# Patient Record
Sex: Female | Born: 1955
Health system: Southern US, Community
[De-identification: ages and names within clinical notes are randomized; demographics above are authoritative.]

## PROBLEM LIST (undated history)

## (undated) DIAGNOSIS — R519 Headache, unspecified: Secondary | ICD-10-CM

## (undated) DIAGNOSIS — M810 Age-related osteoporosis without current pathological fracture: Secondary | ICD-10-CM

## (undated) DIAGNOSIS — E785 Hyperlipidemia, unspecified: Secondary | ICD-10-CM

## (undated) DIAGNOSIS — K219 Gastro-esophageal reflux disease without esophagitis: Secondary | ICD-10-CM

## (undated) DIAGNOSIS — T7840XA Allergy, unspecified, initial encounter: Secondary | ICD-10-CM

## (undated) DIAGNOSIS — C439 Malignant melanoma of skin, unspecified: Secondary | ICD-10-CM

## (undated) DIAGNOSIS — I341 Nonrheumatic mitral (valve) prolapse: Secondary | ICD-10-CM

## (undated) DIAGNOSIS — G8929 Other chronic pain: Secondary | ICD-10-CM

## (undated) HISTORY — DX: Allergy, unspecified, initial encounter: T78.40XA

## (undated) HISTORY — DX: Gastro-esophageal reflux disease without esophagitis: K21.9

## (undated) HISTORY — DX: Other chronic pain: G89.29

## (undated) HISTORY — PX: BREAST BIOPSY: SHX20

## (undated) HISTORY — PX: AUGMENTATION MAMMAPLASTY: SUR837

## (undated) HISTORY — DX: Headache, unspecified: R51.9

## (undated) HISTORY — PX: TONSILLECTOMY: SHX5217

## (undated) HISTORY — DX: Hyperlipidemia, unspecified: E78.5

## (undated) HISTORY — DX: Age-related osteoporosis without current pathological fracture: M81.0

## (undated) HISTORY — DX: Nonrheumatic mitral (valve) prolapse: I34.1

## (undated) HISTORY — PX: WISDOM TOOTH EXTRACTION: SHX21

## (undated) HISTORY — PX: SKIN BIOPSY: SHX1

## (undated) HISTORY — DX: Malignant melanoma of skin, unspecified: C43.9

---

## 1992-02-28 HISTORY — PX: FOOT SURGERY: SHX648

## 2010-02-27 HISTORY — PX: OTHER SURGICAL HISTORY: SHX169

## 2010-02-27 HISTORY — PX: FACIAL COSMETIC SURGERY: SHX629

## 2012-07-04 ENCOUNTER — Encounter: Payer: Self-pay | Admitting: Gastroenterology

## 2012-07-09 ENCOUNTER — Encounter: Payer: Self-pay | Admitting: Gastroenterology

## 2012-08-01 ENCOUNTER — Encounter: Payer: Self-pay | Admitting: Gastroenterology

## 2012-08-01 ENCOUNTER — Ambulatory Visit (AMBULATORY_SURGERY_CENTER): Payer: BC Managed Care – PPO | Admitting: *Deleted

## 2012-08-01 VITALS — Ht 67.0 in | Wt 150.0 lb

## 2012-08-01 DIAGNOSIS — Z1211 Encounter for screening for malignant neoplasm of colon: Secondary | ICD-10-CM

## 2012-08-01 MED ORDER — NA SULFATE-K SULFATE-MG SULF 17.5-3.13-1.6 GM/177ML PO SOLN: ORAL | Status: DC

## 2012-08-15 ENCOUNTER — Encounter: Payer: Self-pay | Admitting: Gastroenterology

## 2012-08-15 DIAGNOSIS — J309 Allergic rhinitis, unspecified: Secondary | ICD-10-CM

## 2012-08-15 HISTORY — DX: Allergic rhinitis, unspecified: J30.9

## 2012-09-03 ENCOUNTER — Encounter: Payer: Self-pay | Admitting: Gastroenterology

## 2012-09-11 HISTORY — PX: COLONOSCOPY: SHX174

## 2012-09-12 ENCOUNTER — Ambulatory Visit (AMBULATORY_SURGERY_CENTER): Payer: BC Managed Care – PPO | Admitting: Gastroenterology

## 2012-09-12 ENCOUNTER — Encounter: Payer: Self-pay | Admitting: Gastroenterology

## 2012-09-12 VITALS — BP 116/66 | HR 59 | Temp 98.2°F | Resp 14 | Ht 67.0 in | Wt 150.0 lb

## 2012-09-12 DIAGNOSIS — Z1211 Encounter for screening for malignant neoplasm of colon: Secondary | ICD-10-CM

## 2012-09-12 DIAGNOSIS — D126 Benign neoplasm of colon, unspecified: Secondary | ICD-10-CM

## 2012-09-12 MED ORDER — SODIUM CHLORIDE 0.9 % IV SOLN: 500.0000 mL | INTRAVENOUS | Status: DC

## 2012-09-12 NOTE — Progress Notes (Signed)
Patient did not experience any of the following events: a burn prior to discharge; a fall within the facility; wrong site/side/patient/procedure/implant event; or a hospital transfer or hospital admission upon discharge from the facility. (G8907) Patient did not have preoperative order for IV antibiotic SSI prophylaxis. (G8918)  

## 2012-09-12 NOTE — Progress Notes (Signed)
Called to room to assist during endoscopic procedure.  Patient ID and intended procedure confirmed with present staff. Received instructions for my participation in the procedure from the performing physician.  

## 2012-09-12 NOTE — Op Note (Signed)
[facility] [address] [place], [address]   COLONOSCOPY PROCEDURE REPORT  PATIENT: Kim Rogers, Kim Rogers  MR#: [id number] BIRTHDATE: 08/27/1955 , 57  yrs. old GENDER: Female ENDOSCOPIST: Louis Meckel, MD REFERRED XB:JYNWGN Derrell Lolling, M.D. PROCEDURE DATE:  09/12/2012 PROCEDURE:   Colonoscopy with snare polypectomy ASA CLASS:   Class I INDICATIONS:average risk screening. MEDICATIONS: MAC sedation, administered by CRNA and propofol (Diprivan) 350mg  IV  DESCRIPTION OF PROCEDURE:   After the risks benefits and alternatives of the procedure were thoroughly explained, informed consent was obtained.  A digital rectal exam revealed no abnormalities of the rectum.   The LB [id number]  endoscope was introduced through the anus and advanced to the cecum, which was identified by both the appendix and ileocecal valve. No adverse events experienced.   The quality of the prep was excellent using Suprep  The instrument was then slowly withdrawn as the colon was fully examined.      COLON FINDINGS: A sessile polyp measuring 3 mm in size was found in the descending colon.  A polypectomy was performed with a cold snare.  The resection was complete and the polyp tissue was completely retrieved.   A pedunculated polyp measuring 10 mm in size was found in the sigmoid colon.  A polypectomy was performed using snare cautery.  The resection was complete and the polyp tissue was completely retrieved.   The colon mucosa was otherwise normal.  Retroflexed views revealed no abnormalities. The time to cecum=5 minutes 06 seconds.  Withdrawal time=14 minutes 0 seconds. The scope was withdrawn and the procedure completed. COMPLICATIONS: There were no complications.  ENDOSCOPIC IMPRESSION: 1.   Sessile polyp measuring 3 mm in size was found in the descending colon; polypectomy was performed with a cold snare 2.   Pedunculated polyp measuring 10 mm in size was found in the sigmoid colon;  polypectomy was performed using snare cautery 3.   The colon mucosa was otherwise normal  RECOMMENDATIONS: If the polyp(s) removed today are proven to be adenomatous (pre-cancerous) polyps, you will need a repeat colonoscopy in 5 years.  Otherwise you should continue to follow colorectal cancer screening guidelines for "routine risk" patients with colonoscopy in 10 years.  You will receive a letter within 1-2 weeks with the results of your biopsy as well as final recommendations.  Please call my office if you have not received a letter after 3 weeks.   eSigned:  Louis Meckel, MD 09/12/2012 9:03 AM   cc:   PATIENT NAME:  Kim Rogers, Kim Rogers MR#: [id number]

## 2012-09-12 NOTE — Patient Instructions (Addendum)
Discharge instructions given with verbal understanding. Handout on polyps given. Resume previous medications. YOU HAD AN ENDOSCOPIC PROCEDURE TODAY AT THE [facility]: Refer to the procedure report that was given to you for any specific questions about what was found during the examination.  If the procedure report does not answer your questions, please call your gastroenterologist to clarify.  If you requested that your care partner not be given the details of your procedure findings, then the procedure report has been included in a sealed envelope for you to review at your convenience later.  YOU SHOULD EXPECT: Some feelings of bloating in the abdomen. Passage of more gas than usual.  Walking can help get rid of the air that was put into your GI tract during the procedure and reduce the bloating. If you had a lower endoscopy (such as a colonoscopy or flexible sigmoidoscopy) you may notice spotting of blood in your stool or on the toilet paper. If you underwent a bowel prep for your procedure, then you may not have a normal bowel movement for a few days.  DIET: Your first meal following the procedure should be a light meal and then it is ok to progress to your normal diet.  A half-sandwich or bowl of soup is an example of a good first meal.  Heavy or fried foods are harder to digest and may make you feel nauseous or bloated.  Likewise meals heavy in dairy and vegetables can cause extra gas to form and this can also increase the bloating.  Drink plenty of fluids but you should avoid alcoholic beverages for 24 hours.  ACTIVITY: Your care partner should take you home directly after the procedure.  You should plan to take it easy, moving slowly for the rest of the day.  You can resume normal activity the day after the procedure however you should NOT DRIVE or use heavy machinery for 24 hours (because of the sedation medicines used during the test).    SYMPTOMS TO REPORT IMMEDIATELY: A  gastroenterologist can be reached at any hour.  During normal business hours, 8:30 AM to 5:00 PM Monday through Friday, call [phone].  After hours and on weekends, please call the GI answering service at [phone] who will take a message and have the physician on call contact you.   Following lower endoscopy (colonoscopy or flexible sigmoidoscopy):  Excessive amounts of blood in the stool  Significant tenderness or worsening of abdominal pains  Swelling of the abdomen that is new, acute  Fever of 100F or higher  FOLLOW UP: If any biopsies were taken you will be contacted by phone or by letter within the next 1-3 weeks.  Call your gastroenterologist if you have not heard about the biopsies in 3 weeks.  Our staff will call the home number listed on your records the next business day following your procedure to check on you and address any questions or concerns that you may have at that time regarding the information given to you following your procedure. This is a courtesy call and so if there is no answer at the home number and we have not heard from you through the emergency physician on call, we will assume that you have returned to your regular daily activities without incident.  SIGNATURES/CONFIDENTIALITY: You and/or your care partner have signed paperwork which will be entered into your electronic medical record.  These signatures attest to the fact that that the information above on your After Visit Summary has   been reviewed and is understood.  Full responsibility of the confidentiality of this discharge information lies with you and/or your care-partner. 

## 2012-09-13 ENCOUNTER — Telehealth: Payer: Self-pay

## 2012-09-13 NOTE — Telephone Encounter (Signed)
Left message on answering machine. 

## 2012-09-20 ENCOUNTER — Telehealth: Payer: Self-pay | Admitting: *Deleted

## 2012-09-20 NOTE — Telephone Encounter (Signed)
Dr Arlyce Dice, you did COLON on pt on 716/14 at [facility]; please advise on RECALL. Thanks.

## 2012-09-23 ENCOUNTER — Encounter: Payer: Self-pay | Admitting: Gastroenterology

## 2012-09-23 NOTE — Telephone Encounter (Signed)
f/u colonoscopy in 5 years.

## 2012-09-23 NOTE — Telephone Encounter (Signed)
Recall entered  °

## 2013-01-10 LAB — HM DEXA SCAN

## 2015-10-11 ENCOUNTER — Ambulatory Visit: Payer: Self-pay | Admitting: Family Medicine

## 2015-10-11 MED FILL — OMEPRAZOLE DR 40 MG CAPSULE: 40 | 30 days supply | Qty: 30 | Fill #0

## 2015-10-28 DIAGNOSIS — D241 Benign neoplasm of right breast: Secondary | ICD-10-CM

## 2015-10-28 HISTORY — DX: Benign neoplasm of right breast: D24.1

## 2015-11-05 MED FILL — OMEPRAZOLE DR 40 MG CAPSULE: 40 | 30 days supply | Qty: 30 | Fill #1

## 2015-11-19 MED FILL — HYDROCODON-APAP 10-325: 10-325 | 7 days supply | Qty: 40 | Fill #0

## 2015-11-19 MED FILL — PROMETHAZINE 25 MG TABLET: 25 | 3 days supply | Qty: 10 | Fill #0

## 2015-11-19 MED FILL — CEFADROXIL 500 MG CAPSULE: 500 | 5 days supply | Qty: 10 | Fill #0

## 2015-11-19 MED FILL — diazePAM 5 MG TABS: 5 | 1 days supply | Qty: 1 | Fill #0

## 2015-12-08 MED FILL — OMEPRAZOLE DR 40 MG CAPSULE: 40 | 30 days supply | Qty: 30 | Fill #2

## 2015-12-17 DIAGNOSIS — K635 Polyp of colon: Secondary | ICD-10-CM | POA: Insufficient documentation

## 2015-12-17 DIAGNOSIS — M810 Age-related osteoporosis without current pathological fracture: Secondary | ICD-10-CM | POA: Insufficient documentation

## 2015-12-17 HISTORY — DX: Polyp of colon: K63.5

## 2016-02-08 MED FILL — OMEPRAZOLE DR 40 MG CAPSULE: 40 | 30 days supply | Qty: 30 | Fill #3

## 2016-03-14 MED FILL — NAPROXEN 500 MG TABLET: 500 | 15 days supply | Qty: 30 | Fill #0

## 2016-07-07 MED FILL — OMEPRAZOLE DR 40 MG CAPSULE: 40 | 30 days supply | Qty: 30 | Fill #4

## 2016-10-06 MED FILL — OMEPRAZOLE DR 40 MG CAPSULE: 40 | 30 days supply | Qty: 30 | Fill #5

## 2017-05-23 MED FILL — MELOXICAM 7.5 MG TABLET: 7.5 | 30 days supply | Qty: 30 | Fill #0

## 2017-10-03 MED FILL — SCOPOLAMINE 1 MG/3DAYS PT72: 1 | 2 days supply | Qty: 2 | Fill #0

## 2017-10-03 MED FILL — OMEPRAZOLE 40 MG CPDR: 40 | 30 days supply | Qty: 30 | Fill #0

## 2017-10-08 MED FILL — SCOPOLAMINE 1 MG/3DAYS PT72: 1 | 6 days supply | Qty: 2 | Fill #1

## 2017-10-11 DIAGNOSIS — J3489 Other specified disorders of nose and nasal sinuses: Secondary | ICD-10-CM

## 2017-10-11 HISTORY — DX: Other specified disorders of nose and nasal sinuses: J34.89

## 2017-10-11 MED FILL — FLUTICASONE PROP 50 MCG SPR: 50 | 30 days supply | Qty: 16 | Fill #0

## 2017-10-11 MED FILL — ONDANSETRON ODT 4 MG TABLET: 4 | 3 days supply | Qty: 10 | Fill #0

## 2017-12-10 MED FILL — FLUTICASONE PROP 50 MCG SPR: 50 | 30 days supply | Qty: 16 | Fill #1

## 2017-12-10 MED FILL — ONDANSETRON ODT 4 MG TABLET: 4 | 4 days supply | Qty: 10 | Fill #1

## 2017-12-10 MED FILL — OMEPRAZOLE 40 MG CPDR: 40 | 30 days supply | Qty: 30 | Fill #1

## 2018-01-09 MED FILL — OMEPRAZOLE 40 MG CPDR: 40 | 30 days supply | Qty: 30 | Fill #2

## 2018-02-25 MED FILL — FLUTICASONE PROP 50 MCG SPR: 50 | 30 days supply | Qty: 16 | Fill #2

## 2018-11-26 DIAGNOSIS — R69 Illness, unspecified: Secondary | ICD-10-CM | POA: Diagnosis not present

## 2018-12-02 ENCOUNTER — Other Ambulatory Visit: Payer: Self-pay

## 2018-12-02 ENCOUNTER — Encounter: Payer: Self-pay | Admitting: Internal Medicine

## 2018-12-02 ENCOUNTER — Ambulatory Visit (INDEPENDENT_AMBULATORY_CARE_PROVIDER_SITE_OTHER): Payer: Medicare HMO | Admitting: Internal Medicine

## 2018-12-02 ENCOUNTER — Encounter: Payer: Self-pay | Admitting: Gastroenterology

## 2018-12-02 VITALS — BP 143/76 | HR 65 | Temp 97.3°F | Resp 16 | Ht 67.0 in | Wt 146.5 lb

## 2018-12-02 DIAGNOSIS — Z1231 Encounter for screening mammogram for malignant neoplasm of breast: Secondary | ICD-10-CM

## 2018-12-02 DIAGNOSIS — Z0000 Encounter for general adult medical examination without abnormal findings: Secondary | ICD-10-CM

## 2018-12-02 DIAGNOSIS — M810 Age-related osteoporosis without current pathological fracture: Secondary | ICD-10-CM

## 2018-12-02 DIAGNOSIS — Z01419 Encounter for gynecological examination (general) (routine) without abnormal findings: Secondary | ICD-10-CM

## 2018-12-02 DIAGNOSIS — Z1211 Encounter for screening for malignant neoplasm of colon: Secondary | ICD-10-CM

## 2018-12-02 LAB — COMPREHENSIVE METABOLIC PANEL
ALT: 14 U/L (ref 0–35)
AST: 15 U/L (ref 0–37)
Albumin: 4.6 g/dL (ref 3.5–5.2)
Alkaline Phosphatase: 96 U/L (ref 39–117)
BUN: 22 mg/dL (ref 6–23)
CO2: 28 mEq/L (ref 19–32)
Calcium: 9.9 mg/dL (ref 8.4–10.5)
Chloride: 102 mEq/L (ref 96–112)
Creatinine, Ser: 0.86 mg/dL (ref 0.40–1.20)
GFR: 66.07 mL/min (ref >60.00)
Glucose, Bld: 85 mg/dL (ref 70–99)
Potassium: 4.3 mEq/L (ref 3.5–5.1)
Sodium: 140 mEq/L (ref 135–145)
Total Bilirubin: 0.6 mg/dL (ref 0.2–1.2)
Total Protein: 7 g/dL (ref 6.0–8.3)

## 2018-12-02 LAB — CBC WITH DIFFERENTIAL/PLATELET
Basophils Absolute: 0.1 10*3/uL (ref 0.0–0.1)
Basophils Relative: 1.2 % (ref 0.0–3.0)
Eosinophils Absolute: 0.1 10*3/uL (ref 0.0–0.7)
Eosinophils Relative: 1.6 % (ref 0.0–5.0)
HCT: 42.9 % (ref 36.0–46.0)
Hemoglobin: 14.1 g/dL (ref 12.0–15.0)
Lymphocytes Relative: 36.8 % (ref 12.0–46.0)
Lymphs Abs: 2.2 10*3/uL (ref 0.7–4.0)
MCHC: 32.8 g/dL (ref 30.0–36.0)
MCV: 91.7 fl (ref 78.0–100.0)
Monocytes Absolute: 0.5 10*3/uL (ref 0.1–1.0)
Monocytes Relative: 7.9 % (ref 3.0–12.0)
Neutro Abs: 3.1 10*3/uL (ref 1.4–7.7)
Neutrophils Relative %: 52.5 % (ref 43.0–77.0)
Platelets: 361 10*3/uL (ref 150.0–400.0)
RBC: 4.68 Mil/uL (ref 3.87–5.11)
RDW: 13.7 % (ref 11.5–15.5)
WBC: 6 10*3/uL (ref 4.0–10.5)

## 2018-12-02 LAB — LIPID PANEL
Cholesterol: 235 mg/dL — ABNORMAL HIGH (ref 0–200)
HDL: 69.2 mg/dL (ref >39.00)
LDL Cholesterol: 149 mg/dL — ABNORMAL HIGH (ref 0–99)
NonHDL: 166.24
Total CHOL/HDL Ratio: 3
Triglycerides: 87 mg/dL (ref 0.0–149.0)
VLDL: 17.4 mg/dL (ref 0.0–40.0)

## 2018-12-02 LAB — TSH: TSH: 1.79 u[IU]/mL (ref 0.35–4.50)

## 2018-12-02 MED ORDER — TETANUS-DIPHTH-ACELL PERTUSSIS 5-2.5-18.5 LF-MCG/0.5 IM SUSP: 0.5000 mL | Freq: Once | INTRAMUSCULAR | 0 refills | Status: AC

## 2018-12-02 MED ORDER — OMEPRAZOLE 40 MG PO CPDR: 40.0000 mg | DELAYED_RELEASE_CAPSULE | Freq: Every day | ORAL | 3 refills | Status: DC | PRN

## 2018-12-02 MED ORDER — SHINGRIX 50 MCG/0.5ML IM SUSR: 0.5000 mL | Freq: Once | INTRAMUSCULAR | 1 refills | Status: AC

## 2018-12-02 MED ORDER — FLUTICASONE PROPIONATE 50 MCG/ACT NA SUSP: 2.0000 | Freq: Every day | NASAL | 12 refills | Status: DC

## 2018-12-02 NOTE — Progress Notes (Signed)
Pre visit review using our clinic review tool, if applicable. No additional management support is needed unless otherwise documented below in the visit note. 

## 2018-12-02 NOTE — Patient Instructions (Signed)
GO TO THE LAB : Get the blood work     GO TO THE FRONT DESK Schedule your next appointment for a physical exam in 1 year    STOP BY THE FIRST FLOOR: You can schedule your mammogram

## 2018-12-02 NOTE — Progress Notes (Signed)
Subjective:    Patient ID: Kim Rogers, female    DOB: September 04, 1955, 63 y.o.   MRN: [id number]  DOS:  12/02/2018 Type of visit - description: New patient.  Here for physical exam In general feeling well No concerns   Review of Systems A 14 point review of systems is negative    Past Medical History:  Diagnosis Date  . Allergy   . Chronic headaches   . GERD (gastroesophageal reflux disease)   . Mitral valve prolapse   . Osteoporosis     Past Surgical History:  Procedure Laterality Date  . AUGMENTATION MAMMAPLASTY    . bilateral breast augmentation  2012  . BREAST BIOPSY Right   . FACIAL COSMETIC SURGERY  2012  . FOOT SURGERY  1994   left  . SKIN BIOPSY     pre-cancer    Social History   Socioeconomic History  . Marital status: Married    Spouse name: 1  . Number of children: Not on file  . Years of education: Not on file  . Highest education level: Not on file  Occupational History  . Not on file  Social Needs  . Financial resource strain: Not on file  . Food insecurity    Worry: Not on file    Inability: Not on file  . Transportation needs    Medical: Not on file    Non-medical: Not on file  Tobacco Use  . Smoking status: Former Smoker    Quit date: 08/01/1976    Years since quitting: 42.3  . Smokeless tobacco: Never Used  Substance and Sexual Activity  . Alcohol use: Yes    Comment: occasional glass of wine  . Drug use: No  . Sexual activity: Not on file  Lifestyle  . Physical activity    Days per week: Not on file    Minutes per session: Not on file  . Stress: Not on file  Relationships  . Social Herbalist on phone: Not on file    Gets together: Not on file    Attends religious service: Not on file    Active member of club or organization: Not on file    Attends meetings of clubs or organizations: Not on file    Relationship status: Not on file  . Intimate partner violence    Fear of current or ex partner: Not on file   Emotionally abused: Not on file    Physically abused: Not on file    Forced sexual activity: Not on file  Other Topics Concern  . Not on file  Social History Narrative   Household pt- husband     Family History  Problem Relation Age of Onset  . Lung cancer Mother   . Hypertension Brother   . Colon cancer Neg Hx   . Breast cancer Neg Hx      Allergies as of 12/02/2018   No Known Allergies     Medication List       Accurate as of December 02, 2018 11:59 PM. If you have any questions, ask your nurse or doctor.        CALCIUM + D PO Take by mouth 2 (two) times daily.   fish oil-omega-3 fatty acids 1000 MG capsule Take 1 g by mouth 2 (two) times daily.   fluticasone 50 MCG/ACT nasal spray Commonly known as: FLONASE Place 2 sprays into both nostrils daily.   METAMUCIL PO Take by mouth. Takes 1 teaspoon  Metamucil daily   multivitamin tablet Take 1 tablet by mouth daily.   omeprazole 40 MG capsule Commonly known as: PRILOSEC Take 1 capsule (40 mg total) by mouth daily as needed.   Shingrix injection Generic drug: Zoster Vaccine Adjuvanted Inject 0.5 mLs into the muscle once for 1 dose. Started by: Kathlene November, MD   Tdap 5-2.5-18.5 LF-MCG/0.5 injection Commonly known as: BOOSTRIX Inject 0.5 mLs into the muscle once for 1 dose. Started by: Kathlene November, MD           Objective:   Physical Exam BP (!) 143/76 (BP Location: Left Arm, Patient Position: Sitting, Cuff Size: Small)   Pulse 65   Temp (!) 97.3 F (36.3 C) (Temporal)   Resp 16   Ht 5\' 7"  (1.702 m)   Wt 146 lb 8 oz (66.5 kg)   SpO2 100%   BMI 22.95 kg/m  General: Well developed, NAD, BMI noted Neck: No  thyromegaly  HEENT:  Normocephalic . Face symmetric, atraumatic Lungs:  CTA B Normal respiratory effort, no intercostal retractions, no accessory muscle use. Heart: RRR,  no murmur.  No pretibial edema bilaterally  Abdomen:  Not distended, soft, non-tender. No rebound or rigidity.   Skin:  Exposed areas without rash. Not pale. Not jaundice Neurologic:  alert & oriented X3.  Speech normal, gait appropriate for age and unassisted Strength symmetric and appropriate for age.  Psych: Cognition and judgment appear intact.  Cooperative with normal attention span and concentration.  Behavior appropriate. No anxious or depressed appearing.     Assessment    Assessment new patient, referred by her husband, previous PCP retired Osteoporosis T score -3.0 (01/10/2013) GERD, meds PRN Allergies  H/o MVP H/o remote migraines, occ HAs Pre-cancer skin lesions, sees derm  PLAN New patient, CPX Osteoporosis: She walks daily up to 3 miles, takes vitamin D.  Last bone density test 2014, she really does not like to take medication consequently there is no need to recheck a bone density test however she will think about it. GERD: Refill medications Allergies, rhinitis: Refill Flonase RTC 1 year

## 2018-12-03 ENCOUNTER — Encounter (HOSPITAL_BASED_OUTPATIENT_CLINIC_OR_DEPARTMENT_OTHER): Payer: Self-pay

## 2018-12-03 ENCOUNTER — Ambulatory Visit (HOSPITAL_BASED_OUTPATIENT_CLINIC_OR_DEPARTMENT_OTHER)
Admission: RE | Admit: 2018-12-03 | Discharge: 2018-12-03 | Disposition: A | Discharge status: Home / Self Care | Payer: Medicare HMO | Source: Ambulatory Visit | Attending: Internal Medicine | Admitting: Internal Medicine

## 2018-12-03 DIAGNOSIS — Z1231 Encounter for screening mammogram for malignant neoplasm of breast: Secondary | ICD-10-CM | POA: Diagnosis not present

## 2018-12-03 DIAGNOSIS — Z0000 Encounter for general adult medical examination without abnormal findings: Secondary | ICD-10-CM

## 2018-12-03 DIAGNOSIS — Z09 Encounter for follow-up examination after completed treatment for conditions other than malignant neoplasm: Secondary | ICD-10-CM | POA: Insufficient documentation

## 2018-12-03 HISTORY — DX: Encounter for follow-up examination after completed treatment for conditions other than malignant neoplasm: Z09

## 2018-12-03 HISTORY — DX: Encounter for general adult medical examination without abnormal findings: Z00.00

## 2018-12-03 NOTE — Assessment & Plan Note (Signed)
New patient, CPX Osteoporosis: She walks daily up to 3 miles, takes vitamin D.  Last bone density test 2014, she really does not like to take medication consequently there is no need to recheck a bone density test however she will think about it. GERD: Refill medications Allergies, rhinitis: Refill Flonase RTC 1 year

## 2018-12-03 NOTE — Assessment & Plan Note (Signed)
Tdap: Rx printed PNM 23 10-2018 Shingrix: Printed today Had a flu shot 2 days ago CCS: Had a colonoscopy few years ago, refer to GI Female care: Referral to gynecology, order a mammogram Labs: CMP, FLP, CBC, TSH Diet, exercise: Doing well, counseled.

## 2018-12-16 DIAGNOSIS — R69 Illness, unspecified: Secondary | ICD-10-CM | POA: Diagnosis not present

## 2018-12-19 ENCOUNTER — Encounter: Payer: Self-pay | Admitting: Gastroenterology

## 2018-12-19 ENCOUNTER — Ambulatory Visit (AMBULATORY_SURGERY_CENTER): Payer: Self-pay | Admitting: *Deleted

## 2018-12-19 ENCOUNTER — Other Ambulatory Visit: Payer: Self-pay

## 2018-12-19 VITALS — Temp 97.1°F | Ht 67.0 in | Wt 149.0 lb

## 2018-12-19 DIAGNOSIS — Z1159 Encounter for screening for other viral diseases: Secondary | ICD-10-CM

## 2018-12-19 DIAGNOSIS — Z86010 Personal history of colonic polyps: Secondary | ICD-10-CM

## 2018-12-19 NOTE — Progress Notes (Signed)
Patient denies any allergies to egg or soy products. Patient had PONV on one of her surgical procedures (breast Implants).  No other anesthesia complications.  Patient denies oxygen use at home and denies diet medications. Emmi instructions for colonoscopy/endoscopy explained and given to patient.

## 2018-12-30 ENCOUNTER — Other Ambulatory Visit: Payer: Self-pay | Admitting: Gastroenterology

## 2018-12-30 DIAGNOSIS — Z1159 Encounter for screening for other viral diseases: Secondary | ICD-10-CM | POA: Diagnosis not present

## 2018-12-30 LAB — SARS CORONAVIRUS 2 (TAT 6-24 HRS): SARS Coronavirus 2: NEGATIVE

## 2019-01-02 ENCOUNTER — Ambulatory Visit (AMBULATORY_SURGERY_CENTER): Payer: Medicare HMO | Admitting: Gastroenterology

## 2019-01-02 ENCOUNTER — Encounter: Payer: Self-pay | Admitting: Gastroenterology

## 2019-01-02 ENCOUNTER — Other Ambulatory Visit: Payer: Self-pay

## 2019-01-02 VITALS — BP 107/74 | HR 79 | Temp 98.3°F | Resp 13 | Ht 67.0 in | Wt 149.0 lb

## 2019-01-02 DIAGNOSIS — D123 Benign neoplasm of transverse colon: Secondary | ICD-10-CM | POA: Diagnosis not present

## 2019-01-02 DIAGNOSIS — D122 Benign neoplasm of ascending colon: Secondary | ICD-10-CM

## 2019-01-02 DIAGNOSIS — Z86010 Personal history of colonic polyps: Secondary | ICD-10-CM | POA: Diagnosis not present

## 2019-01-02 MED ORDER — SODIUM CHLORIDE 0.9 % IV SOLN: 500.0000 mL | Freq: Once | INTRAVENOUS | Status: DC

## 2019-01-02 NOTE — Op Note (Addendum)
[place] Patient Name: Kim Rogers Procedure Date: 01/02/2019 7:18 AM MRN: [id number] Endoscopist: Justice Britain , MD Age: 63 Referring MD:  Date of Birth: 06/06/1955 Gender: Female Account #: [id number] Procedure:                Colonoscopy Indications:              Surveillance: Personal history of adenomatous                            polyps on last colonoscopy > 5 years ago Procedure:                Pre-Anesthesia Assessment:                           - Prior to the procedure, a History and Physical                            was performed, and patient medications and                            allergies were reviewed. The patient's tolerance of                            previous anesthesia was also reviewed. The risks                            and benefits of the procedure and the sedation                            options and risks were discussed with the patient.                            All questions were answered, and informed consent                            was obtained. Prior Anticoagulants: The patient has                            taken no previous anticoagulant or antiplatelet                            agents. ASA Grade Assessment: II - A patient with                            mild systemic disease. After reviewing the risks                            and benefits, the patient was deemed in                            satisfactory condition to undergo the procedure.                           After obtaining informed consent, the colonoscope  was passed under direct vision. Throughout the                            procedure, the patient's blood pressure, pulse, and                            oxygen saturations were monitored continuously. The                            Colonoscope was introduced through the anus and                            advanced to the 5 cm into the ileum. The   colonoscopy was performed without difficulty. The                            patient tolerated the procedure. The quality of the                            bowel preparation was good. The terminal ileum,                            ileocecal valve, appendiceal orifice, and rectum                            were photographed. Scope In: 8:17:28 AM Scope Out: 8:39:48 AM Scope Withdrawal Time: 0 hours 16 minutes 54 seconds  Total Procedure Duration: 0 hours 22 minutes 20 seconds  Findings:                 The digital rectal exam findings include                            hemorrhoids. Pertinent negatives include no                            palpable rectal lesions.                           The terminal ileum and ileocecal valve appeared                            normal.                           Eight sessile polyps were found in the transverse                            colon (1), hepatic flexure (2) and ascending colon                            (5). The polyps were 2 to 6 mm in size. These                            polyps were removed with a cold snare. Resection  and retrieval were complete.                           A few small-mouthed diverticula were found in the                            recto-sigmoid colon and sigmoid colon.                           Normal mucosa was found in the entire colon                            otherwise.                           Non-bleeding non-thrombosed internal hemorrhoids                            were found during retroflexion, during perianal                            exam and during digital exam. The hemorrhoids were                            Grade II (internal hemorrhoids that prolapse but                            reduce spontaneously). Complications:            No immediate complications. Estimated Blood Loss:     Estimated blood loss was minimal. Impression:               - Hemorrhoids found on digital rectal  exam.                           - The examined portion of the ileum was normal.                           - Eight 2 to 6 mm polyps in the transverse colon,                            at the hepatic flexure and in the ascending colon,                            removed with a cold snare. Resected and retrieved.                           - Diverticulosis in the recto-sigmoid colon and in                            the sigmoid colon.                           - Normal mucosa in the entire examined colon.                           -  Non-bleeding non-thrombosed internal hemorrhoids. Recommendation:           - The patient will be observed post-procedure,                            until all discharge criteria are met.                           - Discharge patient to home.                           - Patient has a contact number available for                            emergencies. The signs and symptoms of potential                            delayed complications were discussed with the                            patient. Return to normal activities tomorrow.                            Written discharge instructions were provided to the                            patient.                           - High fiber diet.                           - Use FiberCon 1 tablet PO daily.                           - Continue present medications.                           - Await pathology results.                           - Repeat colonoscopy in 3 years for surveillance.                           - The findings and recommendations were discussed                            with the patient. Justice Britain, MD 01/02/2019 8:47:29 AM

## 2019-01-02 NOTE — Progress Notes (Signed)
Called to room to assist during endoscopic procedure.  Patient ID and intended procedure confirmed with present staff. Received instructions for my participation in the procedure from the performing physician.  

## 2019-01-02 NOTE — Progress Notes (Signed)
A/ox3, pleased with MAC, report to RN 

## 2019-01-02 NOTE — Patient Instructions (Signed)
HANDOUTS GIVEN FOR POLYPS, DIVERTICULOSIS, HEMORRHOIDS AND HIGH FIBER DIET.  USE FIBERCON 1 TABLET DAILY.  YOU HAD AN ENDOSCOPIC PROCEDURE TODAY AT [facility]:   Refer to the procedure report that was given to you for any specific questions about what was found during the examination.  If the procedure report does not answer your questions, please call your gastroenterologist to clarify.  If you requested that your care partner not be given the details of your procedure findings, then the procedure report has been included in a sealed envelope for you to review at your convenience later.  YOU SHOULD EXPECT: Some feelings of bloating in the abdomen. Passage of more gas than usual.  Walking can help get rid of the air that was put into your GI tract during the procedure and reduce the bloating. If you had a lower endoscopy (such as a colonoscopy or flexible sigmoidoscopy) you may notice spotting of blood in your stool or on the toilet paper. If you underwent a bowel prep for your procedure, you may not have a normal bowel movement for a few days.  Please Note:  You might notice some irritation and congestion in your nose or some drainage.  This is from the oxygen used during your procedure.  There is no need for concern and it should clear up in a day or so.  SYMPTOMS TO REPORT IMMEDIATELY:   Following lower endoscopy (colonoscopy or flexible sigmoidoscopy):  Excessive amounts of blood in the stool  Significant tenderness or worsening of abdominal pains  Swelling of the abdomen that is new, acute  Fever of 100F or higher  For urgent or emergent issues, a gastroenterologist can be reached at any hour by calling [phone].   DIET:  We do recommend a small meal at first, but then you may proceed to your regular diet.  Drink plenty of fluids but you should avoid alcoholic beverages for 24 hours.  ACTIVITY:  You should plan to take it easy for the rest of today and you should  NOT DRIVE or use heavy machinery until tomorrow (because of the sedation medicines used during the test).    FOLLOW UP: Our staff will call the number listed on your records 48-72 hours following your procedure to check on you and address any questions or concerns that you may have regarding the information given to you following your procedure. If we do not reach you, we will leave a message.  We will attempt to reach you two times.  During this call, we will ask if you have developed any symptoms of COVID 19. If you develop any symptoms (ie: fever, flu-like symptoms, shortness of breath, cough etc.) before then, please call [phone].  If you test positive for Covid 19 in the 2 weeks post procedure, please call and report this information to [place].    If any biopsies were taken you will be contacted by phone or by letter within the next 1-3 weeks.  Please call us at [phone] if you have not heard about the biopsies in 3 weeks.    SIGNATURES/CONFIDENTIALITY: You and/or your care partner have signed paperwork which will be entered into your electronic medical record.  These signatures attest to the fact that that the information above on your After Visit Summary has been reviewed and is understood.  Full responsibility of the confidentiality of this discharge information lies with you and/or your care-partner.

## 2019-01-02 NOTE — Progress Notes (Signed)
Pt's states no medical or surgical changes since previsit or office visit. Vital signs done by [place]. Temp done by JB.

## 2019-01-06 ENCOUNTER — Telehealth: Payer: Self-pay

## 2019-01-06 ENCOUNTER — Encounter: Payer: Self-pay | Admitting: Gastroenterology

## 2019-01-06 NOTE — Telephone Encounter (Signed)
  Follow up Call-  Call back number 01/02/2019  Post procedure Call Back phone  # [phone]  Permission to leave phone message Yes  Some recent data might be hidden     Patient questions:  Do you have a fever, pain , or abdominal swelling? No. Pain Score  0 *  Have you tolerated food without any problems? Yes.    Have you been able to return to your normal activities? Yes.    Do you have any questions about your discharge instructions: Diet   No. Medications  No. Follow up visit  No.  Do you have questions or concerns about your Care? No.  Actions: * If pain score is 4 or above: No action needed, pain <4.  1. Have you developed a fever since your procedure? no  2.   Have you had an respiratory symptoms (SOB or cough) since your procedure? no  3.   Have you tested positive for COVID 19 since your procedure no  4.   Have you had any family members/close contacts diagnosed with the COVID 19 since your procedure?  no   If yes to any of these questions please route to Joylene John, RN and Alphonsa Gin, Therapist, sports.

## 2019-01-16 ENCOUNTER — Other Ambulatory Visit (HOSPITAL_COMMUNITY)
Admission: RE | Admit: 2019-01-16 | Discharge: 2019-01-16 | Disposition: A | Discharge status: Home / Self Care | Payer: Medicare HMO | Source: Ambulatory Visit | Attending: Obstetrics & Gynecology | Admitting: Obstetrics & Gynecology

## 2019-01-16 ENCOUNTER — Ambulatory Visit (INDEPENDENT_AMBULATORY_CARE_PROVIDER_SITE_OTHER): Payer: Medicare HMO | Admitting: Obstetrics & Gynecology

## 2019-01-16 ENCOUNTER — Encounter: Payer: Self-pay | Admitting: Obstetrics & Gynecology

## 2019-01-16 ENCOUNTER — Other Ambulatory Visit: Payer: Self-pay

## 2019-01-16 VITALS — BP 130/81 | HR 75 | Ht 67.0 in | Wt 149.0 lb

## 2019-01-16 DIAGNOSIS — N952 Postmenopausal atrophic vaginitis: Secondary | ICD-10-CM

## 2019-01-16 DIAGNOSIS — M818 Other osteoporosis without current pathological fracture: Secondary | ICD-10-CM

## 2019-01-16 DIAGNOSIS — R69 Illness, unspecified: Secondary | ICD-10-CM | POA: Diagnosis not present

## 2019-01-16 DIAGNOSIS — Z01419 Encounter for gynecological examination (general) (routine) without abnormal findings: Secondary | ICD-10-CM | POA: Diagnosis not present

## 2019-01-16 DIAGNOSIS — Z1151 Encounter for screening for human papillomavirus (HPV): Secondary | ICD-10-CM | POA: Insufficient documentation

## 2019-01-16 DIAGNOSIS — Z780 Asymptomatic menopausal state: Secondary | ICD-10-CM | POA: Insufficient documentation

## 2019-01-16 NOTE — Progress Notes (Signed)
Pt states that she is experiencing pain with intercourse for 1 year.

## 2019-01-16 NOTE — Progress Notes (Signed)
Subjective:     Kim Rogers is a 63 y.o. female here for a routine exam. G1P1 LMP >14 years prev.  Current complaints: pt reports painful intercourse. This has become progressively worse over the years. She has used [employer] but, did not feel that it helped with her sx.      Gynecologic History No LMP recorded (lmp unknown). Patient is postmenopausal. Contraception: post menopausal status Last Pap: 5 years prev. Results were: normal Last mammogram: 12/03/2018. Results were: normal  Obstetric History OB History  Gravida Para Term Preterm AB Living  1 1 1     1   SAB TAB Ectopic Multiple Live Births          1    # Outcome Date GA Lbr Len/2nd Weight Sex Delivery Anes PTL Lv  1 Term 1976 [redacted]w[redacted]d   F Vag-Spont  N LIV   The following portions of the patient's history were reviewed and updated as appropriate: allergies, current medications, past family history, past medical history, past social history, past surgical history and problem list.  Review of Systems Pertinent items are noted in HPI.    Objective:  BP 130/81   Pulse 75   Ht 5\' 7"  (1.702 m)   Wt 149 lb (67.6 kg)   LMP  (LMP Unknown)   BMI 23.34 kg/m  General Appearance:    Alert, cooperative, no distress, appears stated age  Head:    Normocephalic, without obvious abnormality, atraumatic  Eyes:    conjunctiva/corneas clear, EOM's intact, both eyes  Ears:    Normal external ear canals, both ears  Nose:   Nares normal, septum midline, mucosa normal, no drainage    or sinus tenderness  Throat:   Lips, mucosa, and tongue normal; teeth and gums normal  Neck:   Supple, symmetrical, trachea midline, no adenopathy;    thyroid:  no enlargement/tenderness/nodules  Back:     Symmetric, no curvature, ROM normal, no CVA tenderness  Lungs:     respirations unlabored  Chest Wall:    No tenderness or deformity   Heart:    Regular rate and rhythm  Breast Exam:    No tenderness, masses, or nipple abnormality  Abdomen:     Soft,  non-tender, bowel sounds active all four quadrants,    no masses, no organomegaly  Genitalia:    Normal female there is some age related atrophy. There is an excoriation around the clitoris that is painful to touch with the Q-tip. No uterine enlargement. No adnexal masses. NO cervical lesions.      Extremities:   Extremities normal, atraumatic, no cyanosis or edema  Pulses:   2+ and symmetric all extremities  Skin:   Skin color, texture, turgor normal, no rashes or lesions     Assessment:    Healthy female exam.   Atrophic vaginits- reviewed lubrication vs moisturizers osteoporosis pt declines meds so declines the study .  Vulvar excoriation- probably related to trauma from freq wiping.     Plan:   F/u PAP and hrHPV Coconut oil bid KY gel with intercourse F/u in 6 weeks for repeat exam and reeval of area of excoriation F/u in 1 year for annual   Tamiyah Moulin L. Harraway-Smith, M.D., Cherlynn June

## 2019-01-16 NOTE — Patient Instructions (Signed)
Lubricants  - Water or silicone-based  - No perfumes; avoid glycerin or parabens  - If water based look for information on osmolality  - Lubricate all surfaces as a part of foreplay  - Keep lubricant handy in case more is needed  - Sneaking into bathroom before sex is not a good way to use lubricants   Name of Product Description Perfume- Free Paraben-Free Glycerin-Free PH-balanced Cost per month  Hyalo-GYN Gel in Tampon applicator containing Hydeal-D, a natural source of moisture. Http://www.hyalogyn.com Yes No Yes No $25  Replens Gel in tampon applicator, Clings to vaginal lining to keep it moist Yes Yes No Yes $15-32  K-Y Liquibeads Suppository that melts in the vagina Yes Yes No No $18-36  Neogyn Cream Cream to soothe vulvar dryness and pain, contains cutaneous lysate, a healing ingredient; not internal moisturizer but may help with irritation on the vulvar  LinkMoves.fr Yes Yes No No $39   Vaginal Moisturizers  Atrophic Vaginitis  Atrophic vaginitis is a condition in which the tissues that line the vagina become dry and thin. This condition is most common in women who have stopped having regular menstrual periods (are in menopause). This usually starts when a woman is 65 years old. That is the time when a woman's estrogen levels begin to drop (decrease). Estrogen is a female hormone. It helps to keep the tissues of the vagina moist. It stimulates the vagina to produce a clear fluid that lubricates the vagina for sexual intercourse. This fluid also protects the vagina from infection. Lack of estrogen can cause the lining of the vagina to get thinner and dryer. The vagina may also shrink in size. It may become less elastic. Atrophic vaginitis tends to get worse over time as a woman's estrogen level drops. What are the causes? This condition is caused by the normal drop in estrogen that happens around the time of menopause. What increases the risk? Certain conditions or  situations may lower a woman's estrogen level, leading to a higher risk for atrophic vaginitis. You are more likely to develop this condition if:  You are taking medicines that block estrogen.  You have had your ovaries removed.  You are being treated for cancer with X-ray (radiation) or medicines (chemotherapy).  You have given birth or are breastfeeding.  You are older than age 65.  You smoke. What are the signs or symptoms? Symptoms of this condition include:  Pain, soreness, or bleeding during sexual intercourse (dyspareunia).  Vaginal burning, irritation, or itching.  Pain or bleeding when a speculum is used in a vaginal exam (pelvic exam).  Having burning pain when passing urine.  Vaginal discharge that is brown or yellow. In some cases, there are no symptoms. How is this diagnosed? This condition is diagnosed by taking a medical history and doing a physical exam. This will include a pelvic exam that checks the vaginal tissues. Though rare, you may also have other tests, including:  A urine test.  A test that checks the acid balance in your vagina (acid balance test). How is this treated? Treatment for this condition depends on how severe your symptoms are. Treatment may include:  Using an over-the-counter vaginal lubricant before sex.  Using a long-acting vaginal moisturizer.  Using low-dose vaginal estrogen for moderate to severe symptoms that do not respond to other treatments. Options include creams, tablets, and inserts (vaginal rings). Before you use a vaginal estrogen, tell your health care provider if you have a history of: ? Breast cancer. ?  Endometrial cancer. ? Blood clots. If you are not sexually active and your symptoms are very mild, you may not need treatment. Follow these instructions at home: Medicines  Take over-the-counter and prescription medicines only as told by your health care provider. Do not use herbal or alternative medicines unless your  health care provider says that you can.  Use over-the-counter creams, lubricants, or moisturizers for dryness only as directed by your health care provider. General instructions  If your atrophic vaginitis is caused by menopause, discuss all of your menopause symptoms and treatment options with your health care provider.  Do not douche.  Do not use products that can make your vagina dry. These include: ? Scented feminine sprays. ? Scented tampons. ? Scented soaps.  Vaginal intercourse can help to improve blood flow and elasticity of vaginal tissue. If it hurts to have sex, try using a lubricant or moisturizer just before having intercourse. Contact a health care provider if:  Your discharge looks different than normal.  Your vagina has an unusual smell.  You have new symptoms.  Your symptoms do not improve with treatment.  Your symptoms get worse. Summary  Atrophic vaginitis is a condition in which the tissues that line the vagina become dry and thin. It is most common in women who have stopped having regular menstrual periods (are in menopause).  Treatment options include using vaginal lubricants and low-dose vaginal estrogen.  Contact a health care provider if your vagina has an unusual smell, or if your symptoms get worse or do not improve after treatment. This information is not intended to replace advice given to you by your health care provider. Make sure you discuss any questions you have with your health care provider. Document Released: 06/30/2014 Document Revised: 01/26/2017 Document Reviewed: 11/09/2016 Elsevier Patient Education  2020 [employer].

## 2019-01-20 LAB — CYTOLOGY - PAP
Comment: NEGATIVE
Diagnosis: NEGATIVE
High risk HPV: NEGATIVE

## 2019-03-03 ENCOUNTER — Ambulatory Visit: Payer: Medicare HMO | Admitting: Obstetrics & Gynecology

## 2019-04-28 DIAGNOSIS — J3489 Other specified disorders of nose and nasal sinuses: Secondary | ICD-10-CM | POA: Diagnosis not present

## 2019-05-20 DIAGNOSIS — J343 Hypertrophy of nasal turbinates: Secondary | ICD-10-CM | POA: Diagnosis not present

## 2019-05-20 DIAGNOSIS — J3489 Other specified disorders of nose and nasal sinuses: Secondary | ICD-10-CM | POA: Diagnosis not present

## 2019-06-17 DIAGNOSIS — R69 Illness, unspecified: Secondary | ICD-10-CM | POA: Diagnosis not present

## 2019-10-29 ENCOUNTER — Other Ambulatory Visit (HOSPITAL_BASED_OUTPATIENT_CLINIC_OR_DEPARTMENT_OTHER): Payer: Self-pay | Admitting: Internal Medicine

## 2019-10-29 DIAGNOSIS — Z1231 Encounter for screening mammogram for malignant neoplasm of breast: Secondary | ICD-10-CM

## 2019-11-23 DIAGNOSIS — R69 Illness, unspecified: Secondary | ICD-10-CM | POA: Diagnosis not present

## 2019-11-25 ENCOUNTER — Other Ambulatory Visit: Payer: Self-pay

## 2019-11-25 ENCOUNTER — Ambulatory Visit: Payer: Medicare HMO | Attending: Internal Medicine

## 2019-11-25 DIAGNOSIS — Z23 Encounter for immunization: Secondary | ICD-10-CM

## 2019-11-25 NOTE — Progress Notes (Signed)
   Covid-19 Vaccination Clinic  Name:  Kim Rogers    MRN: [id number] DOB: 11/30/1955  11/25/2019  Kim Rogers was observed post Covid-19 immunization for 15 minutes without incident. She was provided with Vaccine Information Sheet and instruction to access the V-Safe system.  Marcella Fidelis  Kim Rogers was instructed to call 911 with any severe reactions post vaccine: Marland Kitchen Difficulty breathing  . Swelling of face and throat  . A fast heartbeat  . A bad rash all over body  . Dizziness and weakness

## 2019-12-04 ENCOUNTER — Encounter: Payer: Medicare HMO | Admitting: Internal Medicine

## 2019-12-05 ENCOUNTER — Ambulatory Visit (HOSPITAL_BASED_OUTPATIENT_CLINIC_OR_DEPARTMENT_OTHER): Payer: Medicare HMO

## 2019-12-08 ENCOUNTER — Ambulatory Visit (HOSPITAL_BASED_OUTPATIENT_CLINIC_OR_DEPARTMENT_OTHER): Payer: Medicare HMO

## 2019-12-16 ENCOUNTER — Encounter: Payer: Self-pay | Admitting: Internal Medicine

## 2019-12-16 ENCOUNTER — Other Ambulatory Visit: Payer: Self-pay

## 2019-12-16 ENCOUNTER — Ambulatory Visit (INDEPENDENT_AMBULATORY_CARE_PROVIDER_SITE_OTHER): Payer: Medicare HMO | Admitting: Internal Medicine

## 2019-12-16 VITALS — BP 147/90 | HR 54 | Temp 97.9°F | Resp 16 | Ht 67.0 in | Wt 152.5 lb

## 2019-12-16 DIAGNOSIS — Z0000 Encounter for general adult medical examination without abnormal findings: Secondary | ICD-10-CM

## 2019-12-16 DIAGNOSIS — Z23 Encounter for immunization: Secondary | ICD-10-CM

## 2019-12-16 DIAGNOSIS — Z1159 Encounter for screening for other viral diseases: Secondary | ICD-10-CM

## 2019-12-16 DIAGNOSIS — E785 Hyperlipidemia, unspecified: Secondary | ICD-10-CM | POA: Diagnosis not present

## 2019-12-16 MED ORDER — OMEPRAZOLE 40 MG PO CPDR
40.0000 mg | DELAYED_RELEASE_CAPSULE | Freq: Every day | ORAL | 3 refills | Status: DC | PRN
Start: 2019-12-16 — End: 2021-01-18

## 2019-12-16 NOTE — Progress Notes (Signed)
Pre visit review using our clinic review tool, if applicable. No additional management support is needed unless otherwise documented below in the visit note. 

## 2019-12-16 NOTE — Progress Notes (Signed)
Subjective:    Patient ID: Kim Rogers, female    DOB: Dec 25, 1955, 64 y.o.   MRN: [id number]  DOS:  12/16/2019 Type of visit - description: CPX Since the last office visit she is doing great and has no concerns    Review of Systems  A 14 point review of systems is negative    Past Medical History:  Diagnosis Date   Allergy    Chronic headaches    OTC med prn   GERD (gastroesophageal reflux disease)    Mitral valve prolapse    Osteoporosis     Past Surgical History:  Procedure Laterality Date   AUGMENTATION MAMMAPLASTY     bilateral breast augmentation  2012   PONV with anesthesia for this surgery per patient 12/19/18   BREAST BIOPSY Right    COLONOSCOPY  09/11/2012   polyps - dr Deatra Ina   FACIAL COSMETIC SURGERY  2012   mini face lift - same surgery with breast implants   FOOT SURGERY  1994   left   SKIN BIOPSY     pre-cancer lower right abd   TONSILLECTOMY     WISDOM TOOTH EXTRACTION      Allergies as of 12/16/2019   No Known Allergies     Medication List       Accurate as of December 16, 2019 11:59 PM. If you have any questions, ask your nurse or doctor.        Biotin 1000 MCG tablet Take 1,000 mcg by mouth daily.   CALCIUM + D PO Take by mouth 2 (two) times daily.   cholecalciferol 25 MCG (1000 UNIT) tablet Commonly known as: VITAMIN D3 Take 1,000 Units by mouth daily.   CULTURELLE PROBIOTICS PO Take by mouth.   fish oil-omega-3 fatty acids 1000 MG capsule Take 1 g by mouth 2 (two) times daily.   fluticasone 50 MCG/ACT nasal spray Commonly known as: FLONASE Place 2 sprays into both nostrils daily.   METAMUCIL PO Take by mouth. Takes 1 teaspoon Metamucil daily   multivitamin tablet Take 1 tablet by mouth daily.   omeprazole 40 MG capsule Commonly known as: PRILOSEC Take 1 capsule (40 mg total) by mouth daily as needed.   polycarbophil 625 MG tablet Commonly known as: FIBERCON Take 625 mg by mouth daily.    PONARIS NA Place into the nose.   sodium chloride 0.65 % Soln nasal spray Commonly known as: OCEAN Place 1 spray into both nostrils as needed for congestion.          Objective:   Physical Exam BP (!) 147/90 (BP Location: Left Arm, Patient Position: Sitting, Cuff Size: Small)    Pulse (!) 54    Temp 97.9 F (36.6 C) (Oral)    Resp 16    Ht 5\' 7"  (1.702 m)    Wt 152 lb 8 oz (69.2 kg)    LMP  (LMP Unknown)    SpO2 98%    BMI 23.88 kg/m  General: Well developed, NAD, BMI noted Neck: No  thyromegaly  HEENT:  Normocephalic . Face symmetric, atraumatic Lungs:  CTA B Normal respiratory effort, no intercostal retractions, no accessory muscle use. Heart: RRR,  no murmur.  Abdomen:  Not distended, soft, non-tender. No rebound or rigidity.   Lower extremities: no pretibial edema bilaterally  Skin: Exposed areas without rash. Not pale. Not jaundice Neurologic:  alert & oriented X3.  Speech normal, gait appropriate for age and unassisted Strength symmetric and appropriate for age.  Psych: Cognition and judgment appear intact.  Cooperative with normal attention span and concentration.  Behavior appropriate. No anxious or depressed appearing.     Assessment    ASSESSMENT new patient 11/2018 referred by her husband, previous PCP retired Osteoporosis T score -3.0 (01/10/2013) GERD, meds PRN Allergies  H/o MVP H/o remote migraines, occ HAs Pre-cancer skin lesions, sees derm  PLAN Here for CPX Osteoporosis: We again discussed treatment, she is not interested, she remains active and taking calcium and vitamin D supplements. GERD: Refill PPIs, take them sporadically. BP: 146, recommend ambulatory BPs.  See AVS RTC 1 year    This visit occurred during the SARS-CoV-2 public health emergency.  Safety protocols were in place, including screening questions prior to the visit, additional usage of staff PPE, and extensive cleaning of exam room while observing appropriate contact time  as indicated for disinfecting solutions.

## 2019-12-16 NOTE — Patient Instructions (Addendum)
Check the  blood pressure monthly BP GOAL is between 110/65 and  135/85. If it is consistently higher or lower, let me know   GO TO THE LAB : Get the blood work     [place], [place] back for   a physical exam in 1 year

## 2019-12-17 ENCOUNTER — Encounter: Payer: Self-pay | Admitting: Internal Medicine

## 2019-12-17 LAB — COMPREHENSIVE METABOLIC PANEL
AG Ratio: 1.8 (calc) (ref 1.0–2.5)
ALT: 16 U/L (ref 6–29)
AST: 17 U/L (ref 10–35)
Albumin: 4.5 g/dL (ref 3.6–5.1)
Alkaline phosphatase (APISO): 105 U/L (ref 37–153)
BUN: 14 mg/dL (ref 7–25)
CO2: 26 mmol/L (ref 20–32)
Calcium: 9.8 mg/dL (ref 8.6–10.4)
Chloride: 102 mmol/L (ref 98–110)
Creat: 0.82 mg/dL (ref 0.50–0.99)
Globulin: 2.5 g/dL (calc) (ref 1.9–3.7)
Glucose, Bld: 88 mg/dL (ref 65–99)
Potassium: 4 mmol/L (ref 3.5–5.3)
Sodium: 140 mmol/L (ref 135–146)
Total Bilirubin: 0.6 mg/dL (ref 0.2–1.2)
Total Protein: 7 g/dL (ref 6.1–8.1)

## 2019-12-17 LAB — CBC WITH DIFFERENTIAL/PLATELET
Absolute Monocytes: 572 cells/uL (ref 200–950)
Basophils Absolute: 72 cells/uL (ref 0–200)
Basophils Relative: 1.1 %
Eosinophils Absolute: 182 cells/uL (ref 15–500)
Eosinophils Relative: 2.8 %
HCT: 43.3 % (ref 35.0–45.0)
Hemoglobin: 14.6 g/dL (ref 11.7–15.5)
Lymphs Abs: 2399 cells/uL (ref 850–3900)
MCH: 30.6 pg (ref 27.0–33.0)
MCHC: 33.7 g/dL (ref 32.0–36.0)
MCV: 90.8 fL (ref 80.0–100.0)
MPV: 10.6 fL (ref 7.5–12.5)
Monocytes Relative: 8.8 %
Neutro Abs: 3276 cells/uL (ref 1500–7800)
Neutrophils Relative %: 50.4 %
Platelets: 358 10*3/uL (ref 140–400)
RBC: 4.77 10*6/uL (ref 3.80–5.10)
RDW: 12.2 % (ref 11.0–15.0)
Total Lymphocyte: 36.9 %
WBC: 6.5 10*3/uL (ref 3.8–10.8)

## 2019-12-17 LAB — LIPID PANEL
Cholesterol: 244 mg/dL — ABNORMAL HIGH (ref <200)
HDL: 73 mg/dL (ref >=50)
LDL Cholesterol (Calc): 155 mg/dL (calc) — ABNORMAL HIGH
Non-HDL Cholesterol (Calc): 171 mg/dL (calc) — ABNORMAL HIGH (ref <130)
Total CHOL/HDL Ratio: 3.3 (calc) (ref <5.0)
Triglycerides: 69 mg/dL (ref <150)

## 2019-12-17 LAB — HEPATITIS C ANTIBODY
Hepatitis C Ab: NONREACTIVE
SIGNAL TO CUT-OFF: 0.01 (ref <1.00)

## 2019-12-17 NOTE — Assessment & Plan Note (Signed)
Tdap: 2020 S/p shingrix  PNM 23: 10-2018; Prevnar: Today S/p Shingrix Had COVID shots booster Had a flu shot  CCS: Had a colonoscopy   12-2018, next per GI Female care: saw gyn last year, plans to see them again, [facility] 11-2018 Labs: CMP, FLP, CBC, hep C Diet, exercise: Doing great, remains active, eating healthy.

## 2019-12-17 NOTE — Assessment & Plan Note (Signed)
Here for CPX Osteoporosis: We again discussed treatment, she is not interested, she remains active and taking calcium and vitamin D supplements. GERD: Refill PPIs, take them sporadically. BP: 146, recommend ambulatory BPs.  See AVS RTC 1 year

## 2019-12-19 DIAGNOSIS — H01111 Allergic dermatitis of right upper eyelid: Secondary | ICD-10-CM | POA: Diagnosis not present

## 2019-12-22 DIAGNOSIS — R69 Illness, unspecified: Secondary | ICD-10-CM | POA: Diagnosis not present

## 2020-01-12 ENCOUNTER — Ambulatory Visit (HOSPITAL_BASED_OUTPATIENT_CLINIC_OR_DEPARTMENT_OTHER)
Admission: RE | Admit: 2020-01-12 | Discharge: 2020-01-12 | Disposition: A | Discharge status: Home / Self Care | Payer: Medicare HMO | Source: Ambulatory Visit | Attending: Internal Medicine | Admitting: Internal Medicine

## 2020-01-12 ENCOUNTER — Other Ambulatory Visit: Payer: Self-pay

## 2020-01-12 ENCOUNTER — Encounter (HOSPITAL_BASED_OUTPATIENT_CLINIC_OR_DEPARTMENT_OTHER): Payer: Self-pay

## 2020-01-12 DIAGNOSIS — Z1231 Encounter for screening mammogram for malignant neoplasm of breast: Secondary | ICD-10-CM | POA: Insufficient documentation

## 2020-01-16 ENCOUNTER — Other Ambulatory Visit: Payer: Self-pay | Admitting: Internal Medicine

## 2020-01-16 DIAGNOSIS — R928 Other abnormal and inconclusive findings on diagnostic imaging of breast: Secondary | ICD-10-CM

## 2020-01-20 ENCOUNTER — Other Ambulatory Visit: Payer: Self-pay | Admitting: Internal Medicine

## 2020-01-20 ENCOUNTER — Ambulatory Visit
Admission: RE | Admit: 2020-01-20 | Discharge: 2020-01-20 | Disposition: A | Discharge status: Home / Self Care | Payer: Medicare HMO | Source: Ambulatory Visit | Attending: Internal Medicine | Admitting: Internal Medicine

## 2020-01-20 ENCOUNTER — Other Ambulatory Visit: Payer: Self-pay

## 2020-01-20 DIAGNOSIS — R928 Other abnormal and inconclusive findings on diagnostic imaging of breast: Secondary | ICD-10-CM

## 2020-01-20 DIAGNOSIS — R921 Mammographic calcification found on diagnostic imaging of breast: Secondary | ICD-10-CM

## 2020-01-20 NOTE — Telephone Encounter (Signed)
Opened in error

## 2020-01-21 ENCOUNTER — Encounter: Payer: Self-pay | Admitting: Internal Medicine

## 2020-01-26 ENCOUNTER — Other Ambulatory Visit: Payer: Self-pay | Admitting: Internal Medicine

## 2020-01-26 MED ORDER — PROMETHAZINE HCL 12.5 MG RE SUPP: 12.5000 mg | Freq: Four times a day (QID) | RECTAL | 0 refills | Status: DC | PRN

## 2020-01-27 ENCOUNTER — Telehealth: Payer: Self-pay

## 2020-01-27 NOTE — Telephone Encounter (Signed)
PA initiated via Covermymeds; KEY:  [id number]. Awaiting determination.

## 2020-01-27 NOTE — Telephone Encounter (Signed)
PA approved. Effective 02/28/19 to 02/27/2020.  

## 2020-02-02 ENCOUNTER — Encounter: Payer: Self-pay | Admitting: Internal Medicine

## 2020-02-03 ENCOUNTER — Telehealth: Payer: Self-pay

## 2020-02-03 ENCOUNTER — Other Ambulatory Visit: Payer: Self-pay | Admitting: Internal Medicine

## 2020-02-03 MED ORDER — SCOPOLAMINE 1 MG/3DAYS TD PT72: 1.0000 | MEDICATED_PATCH | TRANSDERMAL | 0 refills | Status: DC

## 2020-02-03 NOTE — Telephone Encounter (Signed)
PA approved. Effective 02/28/2019 to 02/27/2020. 

## 2020-02-03 NOTE — Telephone Encounter (Signed)
PA initiated via Covermymeds; KEY: [id number]. Awaiting determination.

## 2020-02-06 ENCOUNTER — Ambulatory Visit
Admission: RE | Admit: 2020-02-06 | Discharge: 2020-02-06 | Disposition: A | Discharge status: Home / Self Care | Payer: Medicare HMO | Source: Ambulatory Visit | Attending: Internal Medicine | Admitting: Internal Medicine

## 2020-02-06 ENCOUNTER — Other Ambulatory Visit: Payer: Self-pay

## 2020-02-06 DIAGNOSIS — R921 Mammographic calcification found on diagnostic imaging of breast: Secondary | ICD-10-CM

## 2020-02-06 DIAGNOSIS — D242 Benign neoplasm of left breast: Secondary | ICD-10-CM | POA: Diagnosis not present

## 2020-03-23 DIAGNOSIS — L570 Actinic keratosis: Secondary | ICD-10-CM | POA: Diagnosis not present

## 2020-03-23 DIAGNOSIS — D485 Neoplasm of uncertain behavior of skin: Secondary | ICD-10-CM | POA: Diagnosis not present

## 2020-03-23 DIAGNOSIS — D225 Melanocytic nevi of trunk: Secondary | ICD-10-CM | POA: Diagnosis not present

## 2020-03-23 DIAGNOSIS — C44519 Basal cell carcinoma of skin of other part of trunk: Secondary | ICD-10-CM | POA: Diagnosis not present

## 2020-03-23 DIAGNOSIS — L821 Other seborrheic keratosis: Secondary | ICD-10-CM | POA: Diagnosis not present

## 2020-03-23 DIAGNOSIS — L814 Other melanin hyperpigmentation: Secondary | ICD-10-CM | POA: Diagnosis not present

## 2020-03-25 DIAGNOSIS — D0359 Melanoma in situ of other part of trunk: Secondary | ICD-10-CM | POA: Diagnosis not present

## 2020-04-05 DIAGNOSIS — D039 Melanoma in situ, unspecified: Secondary | ICD-10-CM

## 2020-04-05 DIAGNOSIS — D0359 Melanoma in situ of other part of trunk: Secondary | ICD-10-CM | POA: Diagnosis not present

## 2020-04-05 HISTORY — DX: Melanoma in situ, unspecified: D03.9

## 2020-04-14 DIAGNOSIS — I8311 Varicose veins of right lower extremity with inflammation: Secondary | ICD-10-CM | POA: Diagnosis not present

## 2020-04-14 DIAGNOSIS — I8312 Varicose veins of left lower extremity with inflammation: Secondary | ICD-10-CM | POA: Diagnosis not present

## 2020-04-20 DIAGNOSIS — I87393 Chronic venous hypertension (idiopathic) with other complications of bilateral lower extremity: Secondary | ICD-10-CM | POA: Diagnosis not present

## 2020-04-21 ENCOUNTER — Ambulatory Visit (INDEPENDENT_AMBULATORY_CARE_PROVIDER_SITE_OTHER): Payer: Medicare HMO | Admitting: Internal Medicine

## 2020-04-21 ENCOUNTER — Other Ambulatory Visit: Payer: Self-pay

## 2020-04-21 ENCOUNTER — Ambulatory Visit (HOSPITAL_BASED_OUTPATIENT_CLINIC_OR_DEPARTMENT_OTHER)
Admission: RE | Admit: 2020-04-21 | Discharge: 2020-04-21 | Disposition: A | Discharge status: Home / Self Care | Payer: Medicare HMO | Source: Ambulatory Visit | Attending: Internal Medicine | Admitting: Internal Medicine

## 2020-04-21 VITALS — BP 122/80 | HR 90 | Temp 98.4°F | Ht 67.0 in | Wt 159.0 lb

## 2020-04-21 DIAGNOSIS — Y939 Activity, unspecified: Secondary | ICD-10-CM | POA: Diagnosis not present

## 2020-04-21 DIAGNOSIS — R0781 Pleurodynia: Secondary | ICD-10-CM

## 2020-04-21 DIAGNOSIS — Z9882 Breast implant status: Secondary | ICD-10-CM | POA: Diagnosis not present

## 2020-04-21 DIAGNOSIS — R921 Mammographic calcification found on diagnostic imaging of breast: Secondary | ICD-10-CM | POA: Diagnosis not present

## 2020-04-21 DIAGNOSIS — W19XXXA Unspecified fall, initial encounter: Secondary | ICD-10-CM

## 2020-04-21 DIAGNOSIS — Y929 Unspecified place or not applicable: Secondary | ICD-10-CM | POA: Diagnosis not present

## 2020-04-21 NOTE — Progress Notes (Signed)
Subjective:    Patient ID: Kim Rogers, female    DOB: [birth year], 65 y.o.   MRN: [id number]  DOS:  04/21/2020 Type of visit - description: Acute 9 days ago, she was taking her daily 3 mile walk and rolled her left ankle and fell apparently on her right. Ankle is now better but she has developed a bruise and a lump at the right thigh also some pain at the right lateral chest.  This was a mechanical incident, no syncope, no LOC, no headache or neck pain.  Review of Systems See above   Past Medical History:  Diagnosis Date  . Allergy   . Chronic headaches    OTC med prn  . GERD (gastroesophageal reflux disease)   . Melanoma ([place])   . Mitral valve prolapse   . Osteoporosis     Past Surgical History:  Procedure Laterality Date  . AUGMENTATION MAMMAPLASTY    . bilateral breast augmentation  2012   PONV with anesthesia for this surgery per patient 12/19/18  . BREAST BIOPSY Right   . COLONOSCOPY  09/11/2012   polyps - dr Deatra Ina  . FACIAL COSMETIC SURGERY  2012   mini face lift - same surgery with breast implants  . FOOT SURGERY  1994   left  . SKIN BIOPSY     pre-cancer lower right abd  . TONSILLECTOMY    . WISDOM TOOTH EXTRACTION      Allergies as of 04/21/2020   No Known Allergies     Medication List       Accurate as of April 21, 2020 11:59 PM. If you have any questions, ask your nurse or doctor.        Biotin 1000 MCG tablet Take 1,000 mcg by mouth daily.   CALCIUM + D PO Take by mouth 2 (two) times daily.   cholecalciferol 25 MCG (1000 UNIT) tablet Commonly known as: VITAMIN D3 Take 1,000 Units by mouth daily.   CULTURELLE PROBIOTICS PO Take by mouth.   fish oil-omega-3 fatty acids 1000 MG capsule Take 1 g by mouth 2 (two) times daily.   fluticasone 50 MCG/ACT nasal spray Commonly known as: FLONASE Place 2 sprays into both nostrils daily.   METAMUCIL PO Take by mouth. Takes 1 teaspoon Metamucil daily   multivitamin tablet Take 1  tablet by mouth daily.   omeprazole 40 MG capsule Commonly known as: PRILOSEC Take 1 capsule (40 mg total) by mouth daily as needed.   polycarbophil 625 MG tablet Commonly known as: FIBERCON Take 625 mg by mouth daily.   PONARIS NA Place into the nose.   promethazine 12.5 MG suppository Commonly known as: PHENERGAN Place 1 suppository (12.5 mg total) rectally every 6 (six) hours as needed for nausea or vomiting.   scopolamine 1 MG/3DAYS Commonly known as: TRANSDERM-SCOP Place 1 patch (1.5 mg total) onto the skin every 3 (three) days.   sodium chloride 0.65 % Soln nasal spray Commonly known as: OCEAN Place 1 spray into both nostrils as needed for congestion.          Objective:   Physical Exam Chest:    Musculoskeletal:       Legs:    BP 122/80 (BP Location: Left Arm, Patient Position: Sitting, Cuff Size: Large)   Pulse 90   Temp 98.4 F (36.9 C) (Oral)   Ht 5\' 7"  (1.702 m)   Wt 159 lb (72.1 kg)   LMP  (LMP Unknown)   SpO2 98%  BMI 24.90 kg/m  General:   Well developed, NAD, BMI noted. HEENT:  Normocephalic . Face symmetric, atraumatic Lungs:  CTA B Normal respiratory effort, no intercostal retractions, no accessory muscle use. Heart: RRR,  no murmur.  MSK: Left wrist, left ankle, left hip: No deformities, ROM normal without pain. Skin: Not pale. Not jaundice Neurologic:  alert & oriented X3.  Speech normal, gait appropriate for age and unassisted Psych--  Cognition and judgment appear intact.  Cooperative with normal attention span and concentration.  Behavior appropriate. No anxious or depressed appearing.      Assessment    ASSESSMENT new patient 11/2018 referred by her husband, previous PCP retired Osteoporosis T score -3.0 (01/10/2013) GERD, meds PRN Allergies  H/o MVP H/o remote migraines, occ HAs Pre-cancer skin lesions, sees derm  PLAN Fall, initial encounter: Fall as described above, has a superficial hematoma at the right leg  and pain at the right thorax. To be sure we will get right rib x-rays, anticipate sxs will resolve within a matter of 2 to 3 weeks, until then Tylenol as needed. Fall prevention discussed I also encouraged her to think about osteoporosis treatment. Preventive care: POA discussed.  This visit occurred during the SARS-CoV-2 public health emergency.  Safety protocols were in place, including screening questions prior to the visit, additional usage of staff PPE, and extensive cleaning of exam room while observing appropriate contact time as indicated for disinfecting solutions.

## 2020-04-21 NOTE — Patient Instructions (Addendum)
Proceed with your x-rays downstairs  For pain take Tylenol as needed, call if you are not gradually better in the next 2 to 3 weeks   Fall Prevention in the Home, Adult Falls can cause injuries and can affect people from all age groups. There are many simple things that you can do to make your home safe and to help prevent falls. Ask for help when making these changes, if needed. What actions can I take to prevent falls? General instructions  Use good lighting in all rooms. Replace any light bulbs that burn out.  Turn on lights if it is dark. Use night-lights.  Place frequently used items in easy-to-reach places. Lower the shelves around your home if necessary.  Set up furniture so that there are clear paths around it. Avoid moving your furniture around.  Remove throw rugs and other tripping hazards from the floor.  Avoid walking on wet floors.  Fix any uneven floor surfaces.  Add color or contrast paint or tape to grab bars and handrails in your home. Place contrasting color strips on the first and last steps of stairways.  When you use a stepladder, make sure that it is completely opened and that the sides are firmly locked. Have someone hold the ladder while you are using it. Do not climb a closed stepladder.  Be aware of any and all pets. What can I do in the bathroom?  Keep the floor dry. Immediately clean up any water that spills onto the floor.  Remove soap buildup in the tub or shower on a regular basis.  Use non-skid mats or decals on the floor of the tub or shower.  Attach bath mats securely with double-sided, non-slip rug tape.  If you need to sit down while you are in the shower, use a plastic, non-slip stool.  Install grab bars by the toilet and in the tub and shower. Do not use towel bars as grab bars.      What can I do in the bedroom?  Make sure that a bedside light is easy to reach.  Do not use oversized bedding that drapes onto the floor.  Have a  firm chair that has side arms to use for getting dressed. What can I do in the kitchen?  Clean up any spills right away.  If you need to reach for something above you, use a sturdy step stool that has a grab bar.  Keep electrical cables out of the way.  Do not use floor polish or wax that makes floors slippery. If you must use wax, make sure that it is non-skid floor wax. What can I do in the stairways?  Do not leave any items on the stairs.  Make sure that you have a light switch at the top of the stairs and the bottom of the stairs. Have them installed if you do not have them.  Make sure that there are handrails on both sides of the stairs. Fix handrails that are broken or loose. Make sure that handrails are as long as the stairways.  Install non-slip stair treads on all stairs in your home.  Avoid having throw rugs at the top or bottom of stairways, or secure the rugs with carpet tape to prevent them from moving.  Choose a carpet design that does not hide the edge of steps on the stairway.  Check any carpeting to make sure that it is firmly attached to the stairs. Fix any carpet that is loose or worn.  What can I do on the outside of my home?  Use bright outdoor lighting.  Regularly repair the edges of walkways and driveways and fix any cracks.  Remove high doorway thresholds.  Trim any shrubbery on the main path into your home.  Regularly check that handrails are securely fastened and in good repair. Both sides of any steps should have handrails.  Install guardrails along the edges of any raised decks or porches.  Clear walkways of debris and clutter, including tools and rocks.  Have leaves, snow, and ice cleared regularly.  Use sand or salt on walkways during winter months.  In the garage, clean up any spills right away, including grease or oil spills. What other actions can I take?  Wear closed-toe shoes that fit well and support your feet. Wear shoes that have  rubber soles or low heels.  Use mobility aids as needed, such as canes, walkers, scooters, and crutches.  Review your medicines with your health care provider. Some medicines can cause dizziness or changes in blood pressure, which increase your risk of falling. Talk with your health care provider about other ways that you can decrease your risk of falls. This may include working with a physical therapist or trainer to improve your strength, balance, and endurance. Where to find more information  Centers for Disease Control and Prevention, [employer]: WebmailGuide.co.za  [employer] on Aging: BrainJudge.co.uk Contact a health care provider if:  You are afraid of falling at home.  You feel weak, drowsy, or dizzy at home.  You fall at home. Summary  There are many simple things that you can do to make your home safe and to help prevent falls.  Ways to make your home safe include removing tripping hazards and installing grab bars in the bathroom.  Ask for help when making these changes in your home. This information is not intended to replace advice given to you by your health care provider. Make sure you discuss any questions you have with your health care provider. Document Revised: 01/26/2017 Document Reviewed: 09/28/2016 Elsevier Patient Education  2021 [place] Directive  Advance directives are legal documents that allow you to make decisions about your health care and medical treatment in case you become unable to communicate for yourself. Advance directives let your wishes be known to family, friends, and health care providers. Discussing and writing advance directives should happen over time rather than all at once. Advance directives can be changed and updated at any time. There are different types of advance directives, such as:  Medical power of attorney.  Living will.  Do not resuscitate (DNR) order or do not attempt resuscitation (DNAR)  order. Health care proxy and medical power of attorney A health care proxy is also called a health care agent. This person is appointed to make medical decisions for you when you are unable to make decisions for yourself. Generally, people ask a trusted friend or family member to act as their proxy and represent their preferences. Make sure you have an agreement with your trusted person to act as your proxy. A proxy may have to make a medical decision on your behalf if your wishes are not known. A medical power of attorney, also called a durable power of attorney for health care, is a legal document that names your health care proxy. Depending on the laws in your state, the document may need to be:  Signed.  Notarized.  Dated.  Copied.  Witnessed.  Incorporated into  your medical record. You may also want to appoint a trusted person to manage your money in the event you are unable to do so. This is called a durable power of attorney for finances. It is a separate legal document from the durable power of attorney for health care. You may choose your health care proxy or someone different to act as your agent in money matters. If you do not appoint a proxy, or there is a concern that the proxy is not acting in your best interest, a court may appoint a guardian to act on your behalf. Living will A living will is a set of instructions that state your wishes about medical care when you cannot express them yourself. Health care providers should keep a copy of your living will in your medical record. You may want to give a copy to family members or friends. To alert caregivers in case of an emergency, you can place a card in your wallet to let them know that you have a living will and where they can find it. A living will is used if you become:  Terminally ill.  Disabled.  Unable to communicate or make decisions. The following decisions should be included in your living will:  To use or not to  use life support equipment, such as dialysis machines and breathing machines (ventilators).  Whether you want a DNR or DNAR order. This tells health care providers not to use cardiopulmonary resuscitation (CPR) if breathing or heartbeat stops.  To use or not to use tube feeding.  To be given or not to be given food and fluids.  Whether you want comfort (palliative) care when the goal becomes comfort rather than a cure.  Whether you want to donate your organs and tissues. A living will does not give instructions for distributing your money and property if you should pass away. DNR or DNAR A DNR or DNAR order is a request not to have CPR in the event that your heart stops beating or you stop breathing. If a DNR or DNAR order has not been made and shared, a health care provider will try to help any patient whose heart has stopped or who has stopped breathing. If you plan to have surgery, talk with your health care provider about how your DNR or DNAR order will be followed if problems occur. What if I do not have an advance directive? Some states assign family decision makers to act on your behalf if you do not have an advance directive. Each state has its own laws about advance directives. You may want to check with your health care provider, attorney, or state representative about the laws in your state. Summary  Advance directives are legal documents that allow you to make decisions about your health care and medical treatment in case you become unable to communicate for yourself.  The process of discussing and writing advance directives should happen over time. You can change and update advance directives at any time.  Advance directives may include a medical power of attorney, a living will, and a DNR or DNAR order. This information is not intended to replace advice given to you by your health care provider. Make sure you discuss any questions you have with your health care  provider. Document Revised: 11/18/2019 Document Reviewed: 11/18/2019 Elsevier Patient Education  2021 [employer].

## 2020-04-22 ENCOUNTER — Telehealth: Payer: Self-pay

## 2020-04-22 NOTE — Assessment & Plan Note (Signed)
Fall, initial encounter: Fall as described above, has a superficial hematoma at the right leg and pain at the right thorax. To be sure we will get right rib x-rays, anticipate sxs will resolve within a matter of 2 to 3 weeks, until then Tylenol as needed. Fall prevention discussed I also encouraged her to think about osteoporosis treatment. Preventive care: POA discussed.

## 2020-04-22 NOTE — Telephone Encounter (Signed)
(  Pt requested call yesterday about x-ray results). Spoke w/ Pt- informed of results. Pt verbalized understanding.

## 2020-06-10 ENCOUNTER — Ambulatory Visit: Payer: Medicare HMO | Attending: Internal Medicine

## 2020-06-10 DIAGNOSIS — Z23 Encounter for immunization: Secondary | ICD-10-CM

## 2020-06-10 NOTE — Progress Notes (Signed)
   Covid-19 Vaccination Clinic  Name:  MAKINZEE DURLEY    MRN: [id number] DOB: 08/16/1955  06/10/2020  Ms. Hallisey was observed post Covid-19 immunization for 15 minutes without incident. She was provided with Vaccine Information Sheet and instruction to access the V-Safe system.   Ms. Nazir was instructed to call 911 with any severe reactions post vaccine: Marland Kitchen Difficulty breathing  . Swelling of face and throat  . A fast heartbeat  . A bad rash all over body  . Dizziness and weakness   Immunizations Administered    Name Date Dose VIS Date Route   PFIZER Comrnaty(Gray TOP) Covid-19 Vaccine 06/10/2020  1:51 PM 0.3 mL 02/05/2020 Intramuscular   Manufacturer: [employer]   Lot: [id number]   NDC: [id number]

## 2020-06-14 ENCOUNTER — Other Ambulatory Visit (HOSPITAL_BASED_OUTPATIENT_CLINIC_OR_DEPARTMENT_OTHER): Payer: Self-pay

## 2020-06-14 MED ORDER — PFIZER-BIONT COVID-19 VAC-TRIS 30 MCG/0.3ML IM SUSP
INTRAMUSCULAR | 0 refills | Status: DC
  Filled 2020-06-14: qty 0.3, 1d supply, fill #0

## 2020-06-29 DIAGNOSIS — Z85820 Personal history of malignant melanoma of skin: Secondary | ICD-10-CM | POA: Diagnosis not present

## 2020-06-29 DIAGNOSIS — L570 Actinic keratosis: Secondary | ICD-10-CM | POA: Diagnosis not present

## 2020-06-29 DIAGNOSIS — D225 Melanocytic nevi of trunk: Secondary | ICD-10-CM | POA: Diagnosis not present

## 2020-06-29 DIAGNOSIS — L237 Allergic contact dermatitis due to plants, except food: Secondary | ICD-10-CM | POA: Diagnosis not present

## 2020-08-10 DIAGNOSIS — H6123 Impacted cerumen, bilateral: Secondary | ICD-10-CM | POA: Diagnosis not present

## 2020-10-19 DIAGNOSIS — D225 Melanocytic nevi of trunk: Secondary | ICD-10-CM | POA: Diagnosis not present

## 2020-10-19 DIAGNOSIS — Z85820 Personal history of malignant melanoma of skin: Secondary | ICD-10-CM | POA: Diagnosis not present

## 2020-10-19 DIAGNOSIS — L570 Actinic keratosis: Secondary | ICD-10-CM | POA: Diagnosis not present

## 2020-10-19 DIAGNOSIS — D485 Neoplasm of uncertain behavior of skin: Secondary | ICD-10-CM | POA: Diagnosis not present

## 2020-10-19 DIAGNOSIS — D2239 Melanocytic nevi of other parts of face: Secondary | ICD-10-CM | POA: Diagnosis not present

## 2020-10-19 DIAGNOSIS — L820 Inflamed seborrheic keratosis: Secondary | ICD-10-CM | POA: Diagnosis not present

## 2020-10-26 ENCOUNTER — Other Ambulatory Visit (HOSPITAL_BASED_OUTPATIENT_CLINIC_OR_DEPARTMENT_OTHER): Payer: Self-pay

## 2020-11-29 ENCOUNTER — Ambulatory Visit: Payer: Medicare HMO | Attending: Internal Medicine

## 2020-11-29 DIAGNOSIS — Z23 Encounter for immunization: Secondary | ICD-10-CM

## 2020-11-29 NOTE — Progress Notes (Signed)
   Covid-19 Vaccination Clinic  Name:  Kim Rogers    MRN: [id number] DOB: 1955-03-21  11/29/2020  [name] was observed post Covid-19 immunization for 15 minutes without incident. She was provided with Vaccine Information Sheet and instruction to access the V-Safe system.   [name] was instructed to call 911 with any severe reactions post vaccine: Difficulty breathing  Swelling of face and throat  A fast heartbeat  A bad rash all over body  Dizziness and weakness

## 2020-12-07 ENCOUNTER — Other Ambulatory Visit (HOSPITAL_BASED_OUTPATIENT_CLINIC_OR_DEPARTMENT_OTHER): Payer: Self-pay

## 2020-12-07 MED ORDER — COVID-19MRNA BIVAL VACC PFIZER 30 MCG/0.3ML IM SUSP
INTRAMUSCULAR | 0 refills | Status: DC
  Filled 2020-12-07: qty 0.3, 1d supply, fill #0

## 2020-12-16 ENCOUNTER — Encounter: Payer: Medicare HMO | Admitting: Internal Medicine

## 2021-01-18 ENCOUNTER — Other Ambulatory Visit (HOSPITAL_BASED_OUTPATIENT_CLINIC_OR_DEPARTMENT_OTHER): Payer: Self-pay | Admitting: Internal Medicine

## 2021-01-18 ENCOUNTER — Encounter: Payer: Self-pay | Admitting: Internal Medicine

## 2021-01-18 ENCOUNTER — Ambulatory Visit (INDEPENDENT_AMBULATORY_CARE_PROVIDER_SITE_OTHER): Payer: Medicare HMO | Admitting: Internal Medicine

## 2021-01-18 ENCOUNTER — Other Ambulatory Visit: Payer: Self-pay

## 2021-01-18 ENCOUNTER — Other Ambulatory Visit: Payer: Self-pay | Admitting: Internal Medicine

## 2021-01-18 VITALS — BP 126/80 | HR 63 | Temp 98.0°F | Resp 16 | Ht 67.0 in | Wt 155.1 lb

## 2021-01-18 DIAGNOSIS — Z85820 Personal history of malignant melanoma of skin: Secondary | ICD-10-CM | POA: Diagnosis not present

## 2021-01-18 DIAGNOSIS — M810 Age-related osteoporosis without current pathological fracture: Secondary | ICD-10-CM

## 2021-01-18 DIAGNOSIS — Z0000 Encounter for general adult medical examination without abnormal findings: Secondary | ICD-10-CM

## 2021-01-18 DIAGNOSIS — L219 Seborrheic dermatitis, unspecified: Secondary | ICD-10-CM | POA: Diagnosis not present

## 2021-01-18 DIAGNOSIS — Z1231 Encounter for screening mammogram for malignant neoplasm of breast: Secondary | ICD-10-CM

## 2021-01-18 DIAGNOSIS — D225 Melanocytic nevi of trunk: Secondary | ICD-10-CM | POA: Diagnosis not present

## 2021-01-18 DIAGNOSIS — D485 Neoplasm of uncertain behavior of skin: Secondary | ICD-10-CM | POA: Diagnosis not present

## 2021-01-18 MED ORDER — OMEPRAZOLE 40 MG PO CPDR: 40.0000 mg | DELAYED_RELEASE_CAPSULE | Freq: Every day | ORAL | 3 refills | Status: DC | PRN

## 2021-01-18 NOTE — Patient Instructions (Signed)
   GO TO THE LAB : Get the blood work     GO TO THE FRONT DESK, [place] Come back for physical exam in 1 year    STOP BY THE FIRST FLOOR: Schedule a bone density test

## 2021-01-18 NOTE — Progress Notes (Signed)
Subjective:    Patient ID: Kim Rogers, female    DOB: [birth year], 65 y.o.   MRN: [id number]  DOS:  01/18/2021 Type of visit - description: CPX  Here for CPX. Has no major concerns, feeling well  Review of Systems   A 14 point review of systems is negative    Past Medical History:  Diagnosis Date   Allergy    Chronic headaches    OTC med prn   GERD (gastroesophageal reflux disease)    Melanoma (HCC)    Mitral valve prolapse    Osteoporosis     Past Surgical History:  Procedure Laterality Date   AUGMENTATION MAMMAPLASTY     bilateral breast augmentation  2012   PONV with anesthesia for this surgery per patient 12/19/18   BREAST BIOPSY Right    COLONOSCOPY  09/11/2012   polyps - dr Deatra Ina   FACIAL COSMETIC SURGERY  2012   mini face lift - same surgery with breast implants   FOOT SURGERY  1994   left   SKIN BIOPSY     pre-cancer lower right abd   TONSILLECTOMY     WISDOM TOOTH EXTRACTION     Social History   Socioeconomic History   Marital status: Married    Spouse name: 1   Number of children: Not on file   Years of education: Not on file   Highest education level: Not on file  Occupational History   Occupation: retired- Medical illustrator   Tobacco Use   Smoking status: Former    Packs/day: 0.50    Years: 5.00    Pack years: 2.50    Types: Cigarettes    Quit date: 08/01/1976    Years since quitting: 44.4   Smokeless tobacco: Never  Vaping Use   Vaping Use: Never used  Substance and Sexual Activity   Alcohol use: Yes    Comment: occasional glass of wine   Drug use: No   Sexual activity: Yes    Birth control/protection: Post-menopausal  Other Topics Concern   Not on file  Social History Narrative   Household pt- husband   Social Determinants of Health   Financial Resource Strain: Not on file  Food Insecurity: Not on file  Transportation Needs: Not on file  Physical Activity: Not on file  Stress: Not on file  Social Connections: Not on  file  Intimate Partner Violence: Not on file    Allergies as of 01/18/2021   No Known Allergies      Medication List        Accurate as of January 18, 2021 11:59 PM. If you have any questions, ask your nurse or doctor.          STOP taking these medications    Pfizer COVID-19 Vac Bivalent injection Generic drug: COVID-19 mRNA bivalent vaccine Therapist, music) Stopped by: Kathlene November, MD   Pfizer-BioNT COVID-19 Vac-TriS Susp injection Generic drug: COVID-19 mRNA Vac-TriS Therapist, music) Stopped by: Kathlene November, MD   promethazine 12.5 MG suppository Commonly known as: PHENERGAN Stopped by: Kathlene November, MD   scopolamine 1 MG/3DAYS Commonly known as: TRANSDERM-SCOP Stopped by: Kathlene November, MD       TAKE these medications    Biotin 1000 MCG tablet Take 1,000 mcg by mouth daily.   CALCIUM + D PO Take by mouth 2 (two) times daily.   cholecalciferol 25 MCG (1000 UNIT) tablet Commonly known as: VITAMIN D3 Take 1,000 Units by mouth daily.   CULTURELLE PROBIOTICS PO Take by  mouth.   fish oil-omega-3 fatty acids 1000 MG capsule Take 1 g by mouth 2 (two) times daily.   fluticasone 50 MCG/ACT nasal spray Commonly known as: FLONASE Place 2 sprays into both nostrils daily.   METAMUCIL PO Take by mouth. Takes 1 teaspoon Metamucil daily   multivitamin tablet Take 1 tablet by mouth daily.   omeprazole 40 MG capsule Commonly known as: PRILOSEC Take 1 capsule (40 mg total) by mouth daily as needed.   polycarbophil 625 MG tablet Commonly known as: FIBERCON Take 625 mg by mouth daily.   PONARIS NA Place into the nose.   sodium chloride 0.65 % Soln nasal spray Commonly known as: OCEAN Place 1 spray into both nostrils as needed for congestion.           Objective:   Physical Exam BP 126/80 (BP Location: Left Arm, Patient Position: Sitting, Cuff Size: Small)   Pulse 63   Temp 98 F (36.7 C) (Oral)   Resp 16   Ht 5\' 7"  (1.702 m)   Wt 155 lb 2 oz (70.4 kg)   LMP  (LMP  Unknown)   SpO2 98%   BMI 24.30 kg/m  General: Well developed, NAD, BMI noted Neck: No  thyromegaly  HEENT:  Normocephalic . Face symmetric, atraumatic Lungs:  CTA B Normal respiratory effort, no intercostal retractions, no accessory muscle use. Heart: RRR,  no murmur.  Abdomen:  Not distended, soft, non-tender. No rebound or rigidity.   Lower extremities: no pretibial edema bilaterally  Skin: Exposed areas without rash. Not pale. Not jaundice Neurologic:  alert & oriented X3.  Speech normal, gait appropriate for age and unassisted Strength symmetric and appropriate for age.  Psych: Cognition and judgment appear intact.  Cooperative with normal attention span and concentration.  Behavior appropriate. No anxious or depressed appearing.     Assessment     ASSESSMENT new patient 11/2018 referred by her husband, previous PCP retired Osteoporosis T score -3.0 (01/10/2013) GERD, meds PRN Allergies  H/o MVP H/o remote migraines, occ HAs Melanoma per pt, skin lesions, sees derm  PLAN Here for CPX Osteoporosis: Last DEXA 01/10/2013, she is very active, takes vitamin D.  She is very reluctant to take medication. Plan: Check vitamin D levels, check a DEXA, further advised with results. GERD: RF PPI History melanoma, skin cancers: Sees dermatology regularly RTC 1 year   This visit occurred during the SARS-CoV-2 public health emergency.  Safety protocols were in place, including screening questions prior to the visit, additional usage of staff PPE, and extensive cleaning of exam room while observing appropriate contact time as indicated for disinfecting solutions.

## 2021-01-19 ENCOUNTER — Encounter: Payer: Self-pay | Admitting: Internal Medicine

## 2021-01-19 LAB — COMPREHENSIVE METABOLIC PANEL
ALT: 11 U/L (ref 0–35)
AST: 15 U/L (ref 0–37)
Albumin: 4.6 g/dL (ref 3.5–5.2)
Alkaline Phosphatase: 87 U/L (ref 39–117)
BUN: 15 mg/dL (ref 6–23)
CO2: 28 mEq/L (ref 19–32)
Calcium: 9.7 mg/dL (ref 8.4–10.5)
Chloride: 103 mEq/L (ref 96–112)
Creatinine, Ser: 0.82 mg/dL (ref 0.40–1.20)
GFR: 73.78 mL/min (ref >60.00)
Glucose, Bld: 80 mg/dL (ref 70–99)
Potassium: 3.8 mEq/L (ref 3.5–5.1)
Sodium: 141 mEq/L (ref 135–145)
Total Bilirubin: 0.8 mg/dL (ref 0.2–1.2)
Total Protein: 7 g/dL (ref 6.0–8.3)

## 2021-01-19 LAB — CBC WITH DIFFERENTIAL/PLATELET
Basophils Absolute: 0.1 10*3/uL (ref 0.0–0.1)
Basophils Relative: 0.8 % (ref 0.0–3.0)
Eosinophils Absolute: 0.1 10*3/uL (ref 0.0–0.7)
Eosinophils Relative: 1.6 % (ref 0.0–5.0)
HCT: 40.9 % (ref 36.0–46.0)
Hemoglobin: 13.5 g/dL (ref 12.0–15.0)
Lymphocytes Relative: 45.1 % (ref 12.0–46.0)
Lymphs Abs: 2.8 10*3/uL (ref 0.7–4.0)
MCHC: 33 g/dL (ref 30.0–36.0)
MCV: 91.2 fl (ref 78.0–100.0)
Monocytes Absolute: 0.5 10*3/uL (ref 0.1–1.0)
Monocytes Relative: 7.7 % (ref 3.0–12.0)
Neutro Abs: 2.8 10*3/uL (ref 1.4–7.7)
Neutrophils Relative %: 44.8 % (ref 43.0–77.0)
Platelets: 290 10*3/uL (ref 150.0–400.0)
RBC: 4.48 Mil/uL (ref 3.87–5.11)
RDW: 13.4 % (ref 11.5–15.5)
WBC: 6.3 10*3/uL (ref 4.0–10.5)

## 2021-01-19 LAB — LIPID PANEL
Cholesterol: 215 mg/dL — ABNORMAL HIGH (ref 0–200)
HDL: 74.3 mg/dL (ref >39.00)
LDL Cholesterol: 127 mg/dL — ABNORMAL HIGH (ref 0–99)
NonHDL: 140.91
Total CHOL/HDL Ratio: 3
Triglycerides: 71 mg/dL (ref 0.0–149.0)
VLDL: 14.2 mg/dL (ref 0.0–40.0)

## 2021-01-19 LAB — VITAMIN D 25 HYDROXY (VIT D DEFICIENCY, FRACTURES): VITD: 29.6 ng/mL — ABNORMAL LOW (ref 30.00–100.00)

## 2021-01-19 LAB — TSH: TSH: 1.24 u[IU]/mL (ref 0.35–5.50)

## 2021-01-19 NOTE — Assessment & Plan Note (Signed)
   ASSESSMENT new patient 11/2018 referred by her husband, previous PCP retired Osteoporosis T score -3.0 (01/10/2013) GERD, meds PRN Allergies  H/o MVP H/o remote migraines, occ HAs Melanoma per pt, skin lesions, sees derm  PLAN Here for CPX Osteoporosis: Last DEXA 01/10/2013, she is very active, takes vitamin D.  She is very reluctant to take medication. Plan: Check vitamin D levels, check a DEXA, further advised with results. GERD: RF PPI History melanoma, skin cancers: Sees dermatology regularly RTC 1 year

## 2021-01-19 NOTE — Assessment & Plan Note (Signed)
Tdap: 2020 S/p shingrix x2 PNM 23: 10-2018; Prevnar 13: 2021 S/p Shingrix Had COVID shots, up-to-date  Had a flu shot  CCS: Had a colonoscopy   12-2018, next 12-2021 per GI letter  Female care: rec to see  gyn; MMG 01-2020, PAP 12-2018  ([facility]) Labs: CMP, FLP, CBC, TSH, vitamin D Diet, exercise: She remains active, eating healthy. Advance care planning package provided

## 2021-01-24 ENCOUNTER — Encounter: Payer: Self-pay | Admitting: Internal Medicine

## 2021-01-24 MED ORDER — VITAMIN D (ERGOCALCIFEROL) 1.25 MG (50000 UNIT) PO CAPS: 50000.0000 [IU] | ORAL_CAPSULE | ORAL | 0 refills | Status: DC

## 2021-01-24 NOTE — Addendum Note (Signed)
Addended byDamita Dunnings D on: 01/24/2021 07:34 AM   Modules accepted: Orders

## 2021-01-27 DIAGNOSIS — D485 Neoplasm of uncertain behavior of skin: Secondary | ICD-10-CM | POA: Diagnosis not present

## 2021-02-22 ENCOUNTER — Ambulatory Visit (HOSPITAL_BASED_OUTPATIENT_CLINIC_OR_DEPARTMENT_OTHER): Payer: Medicare HMO

## 2021-03-07 ENCOUNTER — Encounter (HOSPITAL_BASED_OUTPATIENT_CLINIC_OR_DEPARTMENT_OTHER): Payer: Self-pay

## 2021-03-07 ENCOUNTER — Ambulatory Visit (HOSPITAL_BASED_OUTPATIENT_CLINIC_OR_DEPARTMENT_OTHER)
Admission: RE | Admit: 2021-03-07 | Discharge: 2021-03-07 | Disposition: A | Discharge status: Home / Self Care | Payer: Medicare HMO | Source: Ambulatory Visit | Attending: Internal Medicine | Admitting: Internal Medicine

## 2021-03-07 ENCOUNTER — Other Ambulatory Visit: Payer: Self-pay

## 2021-03-07 DIAGNOSIS — Z1231 Encounter for screening mammogram for malignant neoplasm of breast: Secondary | ICD-10-CM | POA: Insufficient documentation

## 2021-03-07 DIAGNOSIS — M810 Age-related osteoporosis without current pathological fracture: Secondary | ICD-10-CM | POA: Diagnosis not present

## 2021-03-07 DIAGNOSIS — Z780 Asymptomatic menopausal state: Secondary | ICD-10-CM | POA: Diagnosis not present

## 2021-04-12 DIAGNOSIS — H5213 Myopia, bilateral: Secondary | ICD-10-CM | POA: Diagnosis not present

## 2021-04-12 DIAGNOSIS — H524 Presbyopia: Secondary | ICD-10-CM | POA: Diagnosis not present

## 2021-04-12 DIAGNOSIS — H318 Other specified disorders of choroid: Secondary | ICD-10-CM | POA: Diagnosis not present

## 2021-04-12 DIAGNOSIS — H25093 Other age-related incipient cataract, bilateral: Secondary | ICD-10-CM | POA: Diagnosis not present

## 2021-04-18 DIAGNOSIS — D225 Melanocytic nevi of trunk: Secondary | ICD-10-CM | POA: Diagnosis not present

## 2021-04-18 DIAGNOSIS — Z85820 Personal history of malignant melanoma of skin: Secondary | ICD-10-CM | POA: Diagnosis not present

## 2021-04-18 DIAGNOSIS — L821 Other seborrheic keratosis: Secondary | ICD-10-CM | POA: Diagnosis not present

## 2021-04-18 DIAGNOSIS — L570 Actinic keratosis: Secondary | ICD-10-CM | POA: Diagnosis not present

## 2021-06-28 DIAGNOSIS — Z0100 Encounter for examination of eyes and vision without abnormal findings: Secondary | ICD-10-CM | POA: Diagnosis not present

## 2021-07-19 ENCOUNTER — Ambulatory Visit: Payer: Medicare HMO | Attending: Internal Medicine

## 2021-07-19 DIAGNOSIS — Z23 Encounter for immunization: Secondary | ICD-10-CM

## 2021-07-19 NOTE — Progress Notes (Signed)
   Covid-19 Vaccination Clinic  Name:  AVINA EBERLE    MRN: [id number] DOB: August 25, 1955  07/19/2021  Ms. Show was observed post Covid-19 immunization for 15 minutes without incident. She was provided with Vaccine Information Sheet and instruction to access the V-Safe system.   Ms. Peloso was instructed to call 911 with any severe reactions post vaccine: Difficulty breathing  Swelling of face and throat  A fast heartbeat  A bad rash all over body  Dizziness and weakness   Immunizations Administered     Name Date Dose VIS Date Route   Pfizer Covid-19 Vaccine Bivalent Booster 07/19/2021 11:20 AM 0.3 mL 10/27/2020 Intramuscular   Manufacturer: [place]   Lot: [id number]   [place]: [phone]

## 2021-07-22 ENCOUNTER — Other Ambulatory Visit (HOSPITAL_BASED_OUTPATIENT_CLINIC_OR_DEPARTMENT_OTHER): Payer: Self-pay

## 2021-07-22 MED ORDER — PFIZER COVID-19 VAC BIVALENT 30 MCG/0.3ML IM SUSP
INTRAMUSCULAR | 0 refills | Status: DC
  Filled 2021-07-22: qty 0.3, 1d supply, fill #0

## 2021-10-17 DIAGNOSIS — L821 Other seborrheic keratosis: Secondary | ICD-10-CM | POA: Diagnosis not present

## 2021-10-17 DIAGNOSIS — Z85820 Personal history of malignant melanoma of skin: Secondary | ICD-10-CM | POA: Diagnosis not present

## 2021-10-17 DIAGNOSIS — D225 Melanocytic nevi of trunk: Secondary | ICD-10-CM | POA: Diagnosis not present

## 2021-10-17 DIAGNOSIS — L570 Actinic keratosis: Secondary | ICD-10-CM | POA: Diagnosis not present

## 2021-12-26 DIAGNOSIS — L570 Actinic keratosis: Secondary | ICD-10-CM | POA: Diagnosis not present

## 2021-12-26 DIAGNOSIS — L814 Other melanin hyperpigmentation: Secondary | ICD-10-CM | POA: Diagnosis not present

## 2022-01-20 ENCOUNTER — Encounter: Payer: Medicare HMO | Admitting: Internal Medicine

## 2022-02-17 ENCOUNTER — Encounter: Payer: Self-pay | Admitting: Family Medicine

## 2022-02-17 ENCOUNTER — Ambulatory Visit (INDEPENDENT_AMBULATORY_CARE_PROVIDER_SITE_OTHER): Payer: Medicare HMO | Admitting: Family Medicine

## 2022-02-17 VITALS — BP 114/78 | HR 72 | Temp 98.6°F | Ht 66.0 in | Wt 164.0 lb

## 2022-02-17 DIAGNOSIS — B9689 Other specified bacterial agents as the cause of diseases classified elsewhere: Secondary | ICD-10-CM

## 2022-02-17 DIAGNOSIS — J208 Acute bronchitis due to other specified organisms: Secondary | ICD-10-CM

## 2022-02-17 MED ORDER — DOXYCYCLINE HYCLATE 100 MG PO TABS: 100.0000 mg | ORAL_TABLET | Freq: Two times a day (BID) | ORAL | 0 refills | Status: AC

## 2022-02-17 MED ORDER — FLUCONAZOLE 150 MG PO TABS: ORAL_TABLET | ORAL | 0 refills | Status: DC

## 2022-02-17 NOTE — Progress Notes (Signed)
Chief Complaint  Patient presents with   Nasal Congestion    Thinks may have had Shingles   Sore Throat    Mohammed Kindle here for URI complaints.  Duration: 3 weeks  Associated symptoms: sinus headache, sinus congestion, sinus pain, sore throat, and productive cough Denies: rhinorrhea, itchy watery eyes, ear pain, ear drainage, wheezing, shortness of breath, and fevers Treatment to date: Vit C, Echinacea Sick contacts: No  Past Medical History:  Diagnosis Date   Allergy    Chronic headaches    OTC med prn   GERD (gastroesophageal reflux disease)    Melanoma (HCC)    Mitral valve prolapse    Osteoporosis     Objective BP 114/78 (BP Location: Left Arm, Patient Position: Sitting, Cuff Size: Normal)   Pulse 72   Temp 98.6 F (37 C) (Oral)   Ht '5\' 6"'$  (1.676 m)   Wt 164 lb (74.4 kg)   LMP  (LMP Unknown)   SpO2 97%   BMI 26.47 kg/m  General: Awake, alert, appears stated age HEENT: AT, [place], ears patent b/l and TM's neg, nares patent w/o discharge, pharynx pink and without exudates, MMM Neck: No masses or asymmetry Heart: RRR Lungs: CTAB, no accessory muscle use Psych: Age appropriate judgment and insight, normal mood and affect  Acute bacterial bronchitis - Plan: fluconazole (DIFLUCAN) 150 MG tablet, doxycycline (VIBRA-TABS) 100 MG tablet  Given duration, will send in 7 d of doxy. Diflucan should she develop a yeast infection. Continue to push fluids, practice good hand hygiene, cover mouth when coughing. F/u prn. If starting to experience fevers, shaking, or shortness of breath, seek immediate care. Pt voiced understanding and agreement to the plan.  [place], DO 02/17/22 12:31 PM

## 2022-02-17 NOTE — Patient Instructions (Signed)
Continue to push fluids, practice good hand hygiene, and cover your mouth if you cough. ? ?If you start having fevers, shaking or shortness of breath, seek immediate care. ? ?OK to take Tylenol 1000 mg (2 extra strength tabs) or 975 mg (3 regular strength tabs) every 6 hours as needed. ? ?Let us know if you need anything. ?

## 2022-03-03 ENCOUNTER — Encounter: Payer: Self-pay | Admitting: Internal Medicine

## 2022-03-03 ENCOUNTER — Ambulatory Visit (HOSPITAL_BASED_OUTPATIENT_CLINIC_OR_DEPARTMENT_OTHER)
Admission: RE | Admit: 2022-03-03 | Discharge: 2022-03-03 | Disposition: A | Discharge status: Home / Self Care | Payer: Medicare HMO | Source: Ambulatory Visit | Attending: Internal Medicine | Admitting: Internal Medicine

## 2022-03-03 ENCOUNTER — Ambulatory Visit (INDEPENDENT_AMBULATORY_CARE_PROVIDER_SITE_OTHER): Payer: Medicare HMO | Admitting: Internal Medicine

## 2022-03-03 VITALS — BP 122/76 | HR 80 | Temp 98.4°F | Resp 16 | Ht 66.0 in | Wt 165.1 lb

## 2022-03-03 DIAGNOSIS — E785 Hyperlipidemia, unspecified: Secondary | ICD-10-CM

## 2022-03-03 DIAGNOSIS — M19041 Primary osteoarthritis, right hand: Secondary | ICD-10-CM | POA: Diagnosis not present

## 2022-03-03 DIAGNOSIS — E559 Vitamin D deficiency, unspecified: Secondary | ICD-10-CM

## 2022-03-03 DIAGNOSIS — H6120 Impacted cerumen, unspecified ear: Secondary | ICD-10-CM | POA: Diagnosis not present

## 2022-03-03 DIAGNOSIS — M810 Age-related osteoporosis without current pathological fracture: Secondary | ICD-10-CM | POA: Diagnosis not present

## 2022-03-03 DIAGNOSIS — R052 Subacute cough: Secondary | ICD-10-CM | POA: Insufficient documentation

## 2022-03-03 DIAGNOSIS — M19042 Primary osteoarthritis, left hand: Secondary | ICD-10-CM

## 2022-03-03 DIAGNOSIS — Z1211 Encounter for screening for malignant neoplasm of colon: Secondary | ICD-10-CM

## 2022-03-03 DIAGNOSIS — Z0001 Encounter for general adult medical examination with abnormal findings: Secondary | ICD-10-CM

## 2022-03-03 DIAGNOSIS — R059 Cough, unspecified: Secondary | ICD-10-CM | POA: Diagnosis not present

## 2022-03-03 DIAGNOSIS — Z1231 Encounter for screening mammogram for malignant neoplasm of breast: Secondary | ICD-10-CM

## 2022-03-03 DIAGNOSIS — Z0000 Encounter for general adult medical examination without abnormal findings: Secondary | ICD-10-CM

## 2022-03-03 LAB — VITAMIN D 25 HYDROXY (VIT D DEFICIENCY, FRACTURES): VITD: 33.67 ng/mL (ref 30.00–100.00)

## 2022-03-03 LAB — CBC WITH DIFFERENTIAL/PLATELET
Basophils Absolute: 0.1 10*3/uL (ref 0.0–0.1)
Basophils Relative: 1.4 % (ref 0.0–3.0)
Eosinophils Absolute: 0.2 10*3/uL (ref 0.0–0.7)
Eosinophils Relative: 2.7 % (ref 0.0–5.0)
HCT: 42.1 % (ref 36.0–46.0)
Hemoglobin: 14 g/dL (ref 12.0–15.0)
Lymphocytes Relative: 41.5 % (ref 12.0–46.0)
Lymphs Abs: 2.5 10*3/uL (ref 0.7–4.0)
MCHC: 33.2 g/dL (ref 30.0–36.0)
MCV: 91.2 fl (ref 78.0–100.0)
Monocytes Absolute: 0.5 10*3/uL (ref 0.1–1.0)
Monocytes Relative: 8.9 % (ref 3.0–12.0)
Neutro Abs: 2.7 10*3/uL (ref 1.4–7.7)
Neutrophils Relative %: 45.5 % (ref 43.0–77.0)
Platelets: 371 10*3/uL (ref 150.0–400.0)
RBC: 4.62 Mil/uL (ref 3.87–5.11)
RDW: 13.6 % (ref 11.5–15.5)
WBC: 6 10*3/uL (ref 4.0–10.5)

## 2022-03-03 LAB — LIPID PANEL
Cholesterol: 253 mg/dL — ABNORMAL HIGH (ref 0–200)
HDL: 73.8 mg/dL (ref >39.00)
LDL Cholesterol: 162 mg/dL — ABNORMAL HIGH (ref 0–99)
NonHDL: 179.14
Total CHOL/HDL Ratio: 3
Triglycerides: 86 mg/dL (ref 0.0–149.0)
VLDL: 17.2 mg/dL (ref 0.0–40.0)

## 2022-03-03 LAB — COMPREHENSIVE METABOLIC PANEL
ALT: 29 U/L (ref 0–35)
AST: 27 U/L (ref 0–37)
Albumin: 4.4 g/dL (ref 3.5–5.2)
Alkaline Phosphatase: 97 U/L (ref 39–117)
BUN: 14 mg/dL (ref 6–23)
CO2: 28 mEq/L (ref 19–32)
Calcium: 9.5 mg/dL (ref 8.4–10.5)
Chloride: 103 mEq/L (ref 96–112)
Creatinine, Ser: 0.8 mg/dL (ref 0.40–1.20)
GFR: 75.41 mL/min (ref >60.00)
Glucose, Bld: 90 mg/dL (ref 70–99)
Potassium: 4 mEq/L (ref 3.5–5.1)
Sodium: 140 mEq/L (ref 135–145)
Total Bilirubin: 0.7 mg/dL (ref 0.2–1.2)
Total Protein: 6.8 g/dL (ref 6.0–8.3)

## 2022-03-03 MED ORDER — OMEPRAZOLE 40 MG PO CPDR: 40.0000 mg | DELAYED_RELEASE_CAPSULE | Freq: Every day | ORAL | 3 refills | Status: DC | PRN

## 2022-03-03 NOTE — Progress Notes (Signed)
Subjective:    Patient ID: Kim Rogers, female    DOB: June 18, 1955, 67 y.o.   MRN: [id number]  DOS:  03/03/2022 Type of visit - description: CPX  Here for CPX.  Multiple other issues discussed. Having respiratory symptoms for the last 5 weeks. Still has on and off cough, postnasal dripping, denies any sputum, fever or chills at this point. No chest pain no difficulty breathing.  Concerned about osteoporosis  Complaining of finger joints discomfort without swelling-warmness-redness.  Sometimes she feels a pop at the left fourth finger.  Review of Systems See above   Past Medical History:  Diagnosis Date   Allergy    Chronic headaches    OTC med prn   GERD (gastroesophageal reflux disease)    Melanoma (HCC)    Mitral valve prolapse    Osteoporosis     Past Surgical History:  Procedure Laterality Date   AUGMENTATION MAMMAPLASTY     bilateral breast augmentation  2012   PONV with anesthesia for this surgery per patient 12/19/18   BREAST BIOPSY Right    COLONOSCOPY  09/11/2012   polyps - dr Deatra Ina   FACIAL COSMETIC SURGERY  2012   mini face lift - same surgery with breast implants   FOOT SURGERY  1994   left   SKIN BIOPSY     pre-cancer lower right abd   TONSILLECTOMY     WISDOM TOOTH EXTRACTION      Current Outpatient Medications  Medication Instructions   Biotin 1,000 mcg, Oral, Daily   Calcium Carbonate-Vitamin D (CALCIUM + D PO) 2 times daily   fish oil-omega-3 fatty acids 1 g, 2 times daily   fluconazole (DIFLUCAN) 150 MG tablet Take 1 tab, repeat in 72 hours if no improvement.   Misc Natural Product Nasal (PONARIS NA) Nasal   Multiple Vitamin (MULTIVITAMIN) tablet 1 tablet, Daily   omeprazole (PRILOSEC) 40 mg, Oral, Daily PRN   polycarbophil (FIBERCON) 625 mg, Oral, Daily   Probiotic Product (CULTURELLE PROBIOTICS PO) Oral   Psyllium (METAMUCIL PO) Oral, Takes 1 teaspoon Metamucil daily    sodium chloride (OCEAN) 0.65 % SOLN nasal spray 1 spray, Each  Nare, As needed   Vitamin D (Ergocalciferol) (DRISDOL) 50,000 Units, Oral, Every 7 days       Objective:   Physical Exam BP 122/76   Pulse 80   Temp 98.4 F (36.9 C) (Oral)   Resp 16   Ht '5\' 6"'$  (1.676 m)   Wt 165 lb 2 oz (74.9 kg)   LMP  (LMP Unknown)   SpO2 96%   BMI 26.65 kg/m  General: Well developed, NAD, BMI noted Neck: No  thyromegaly  HEENT:  Normocephalic . Face symmetric, atraumatic. TMs: Normal, + wax bilaterally noted. Throat:: Not red Lungs:  CTA B Normal respiratory effort, no intercostal retractions, no accessory muscle use. Heart: RRR,  no murmur.  Abdomen:  Not distended, soft, non-tender. No rebound or rigidity.   Lower extremities: no pretibial edema bilaterally Upper extremities: Some bony changes consistent with DJD, no trigger phenomena but I did feel some "pop" at the dorsum of the fourth left finger. Skin: Exposed areas without rash. Not pale. Not jaundice Neurologic:  alert & oriented X3.  Speech normal, gait appropriate for age and unassisted Strength symmetric and appropriate for age.  Psych: Cognition and judgment appear intact.  Cooperative with normal attention span and concentration.  Behavior appropriate. No anxious or depressed appearing.     Assessment    ASSESSMENT  new patient 11/2018 referred by her husband, previous PCP retired Osteoporosis T score -3.0 (01/10/2013) GERD, meds PRN Allergies  H/o MVP H/o remote migraines, occ HAs Melanoma per pt, skin lesions, sees derm  PLAN Here for CPX Osteoporosis, vitamin D deficiency:  Last T-score -2.9 (02-2021), recommend to remain physically active, continue OTCs vitamin D, will check vitamin D levels.  Advised the pros and cons of osteoporosis treatment mostly reduction of fracture risk.  Many medications discussed in detail, she remains reluctant to proceed.  Offered referral to Endo for further eval.  She will let me know if interested. GERD: RF PPIs Cough: Going on for several  weeks, she is a former smoker.  Exam is benign.  She has postnasal dripping but no quizzing or sputum production. Plan: Flonase, Astepro, chest x-ray.  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 Cerumen impaction: Recommend Debrox RTC 1 year  Tdap: 2020 S/p shingrix x2 RSV: rec  PNM 23: 10-2018; Prevnar 13: 2021 S/p Shingrix Covid vax - up-to-date  Had a flu shot  CCS: Had a colonoscopy   12-2018, next is due.  Referral placed, patient will call GI Female care: years ago told to RTC to gyn prn, currently asx.last  PAP 12-2018  ([facility]) Plans to schedule a [facility] Labs: CMP, FLP, CBC, TSH, vitamin D Diet, exercise: She remains active, eating healthy. Healthcare POA: See AVS     Osteoporosis: Last DEXA 01/10/2013, she is very active, takes vitamin D.  She is very reluctant to take medication. Plan: Check vitamin D levels, check a DEXA, further advised with results. GERD: RF PPI History melanoma, skin cancers: Sees dermatology regularly RTC 1 year

## 2022-03-03 NOTE — Patient Instructions (Addendum)
Vaccines I recommend:  RSV vaccine  For cough: Try Astepro 2 sprays on each side of the nose twice daily Try Flonase 2 sprays on each side of the nose once daily Proceed with x-ray today at the first floor  For wax in the ears: Get over-the-counter Debrox, follow the instructions in the box.     GO TO THE LAB : Get the blood work     [place], [place] back for   a physical exam in 1 year    "[place] of attorney" ,  "Living will" (Advance care planning documents)  If you already have a living will or healthcare power of attorney, is recommended you bring the copy to be scanned in your chart.   The document will be available to all the doctors you see in the system.  Advance care planning is a process that supports adults in  understanding and sharing their preferences regarding future medical care.  The patient's preferences are recorded in documents called Advance Directives and the can be modified at any time while the patient is in full mental capacity.   If you don't have one, please consider create one.      More information at: [email or web address]

## 2022-03-04 ENCOUNTER — Encounter: Payer: Self-pay | Admitting: Internal Medicine

## 2022-03-04 NOTE — Assessment & Plan Note (Signed)
Here for CPX Osteoporosis, vitamin D deficiency:  Last T-score -2.9 (02-2021), rx to stay active, continue OTCs vitamin D, will check vitamin D levels.   Advised pros and cons of osteoporosis treatment mostly reduction of fracture risk.  Many medications discussed in detail, she remains reluctant to proceed.  Offered referral to Endo for further eval.  She will let me know if interested. GERD: RF PPIs Cough: Going on for several weeks, she is a former smoker.  Exam is benign.  She has postnasal dripping but no wheezing or sputum production. Plan: Flonase, Astepro, chest x-ray.  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 Cerumen impaction: Recommend Debrox RTC 1 year

## 2022-03-04 NOTE — Assessment & Plan Note (Signed)
Tdap: 2020 S/p shingrix x2 RSV: rec  PNM 23: 10-2018; Prevnar 13: 2021 S/p Shingrix Covid vax - up-to-date  Had a flu shot  CCS: Had a colonoscopy   12-2018, next is due.  Referral placed, patient will call GI Female care: years ago told to RTC to gyn prn, currently asx.last  PAP 12-2018  ([facility]) Plans to schedule a MMG Labs: CMP, FLP, CBC, TSH, vitamin D Diet, exercise: She remains active, eating healthy. Healthcare POA: See AVS

## 2022-03-06 NOTE — Addendum Note (Signed)
Addended byDamita Dunnings D on: 03/06/2022 07:01 AM   Modules accepted: Orders

## 2022-03-08 ENCOUNTER — Encounter: Payer: Self-pay | Admitting: Family Medicine

## 2022-03-08 ENCOUNTER — Ambulatory Visit (INDEPENDENT_AMBULATORY_CARE_PROVIDER_SITE_OTHER): Payer: Medicare HMO | Admitting: Family Medicine

## 2022-03-08 VITALS — BP 131/86 | HR 70 | Temp 98.1°F | Resp 16 | Ht 67.0 in | Wt 163.9 lb

## 2022-03-08 DIAGNOSIS — H6123 Impacted cerumen, bilateral: Secondary | ICD-10-CM | POA: Diagnosis not present

## 2022-03-08 NOTE — Progress Notes (Signed)
   Acute Office Visit  Subjective:     Patient ID: Kim Rogers, female    DOB: Nov 18, 1955, 67 y.o.   MRN: [id number]  Chief Complaint  Patient presents with   ear clogged    Left ear    HPI Patient is in today for cerumen impaction.   Patient reports she had CPE last week with Dr. Larose Kells and he noted cerumen impaction. He recommended she try debrox drops OTC. She has been trying them but feels like it has made her left ear feel worse. Feels clogged and pressure with muffled earring. No sharp pain, fevers, chills. No right ear symptoms.     ROS All review of systems negative except what is listed in the HPI      Objective:    BP 131/86   Pulse 70   Temp 98.1 F (36.7 C)   Resp 16   Ht '5\' 7"'$  (1.702 m)   Wt 163 lb 14.4 oz (74.3 kg)   LMP  (LMP Unknown)   SpO2 95%   BMI 25.67 kg/m    Physical Exam Vitals reviewed.  Constitutional:      Appearance: Normal appearance.  HENT:     Head: Normocephalic and atraumatic.     Right Ear: There is impacted cerumen.     Left Ear: There is impacted cerumen.  Neurological:     General: No focal deficit present.     Mental Status: She is alert and oriented to person, place, and time. Mental status is at baseline.  Psychiatric:        Mood and Affect: Mood normal.        Behavior: Behavior normal.        Thought Content: Thought content normal.        Judgment: Judgment normal.     No results found for any visits on 03/08/22.      Assessment & Plan:   Problem List Items Addressed This Visit   None Visit Diagnoses     Bilateral impacted cerumen    -  Primary     Unsuccessful irrigation in office with multiple attempts by CMA. Patient not interested in trying any more Debrox drops or home irrigation as that is what made her symptoms worse initially. Referral to ENT to have them assist with removal.  Patient aware of signs/symptoms requiring further/urgent evaluation.     No orders of the defined types were placed  in this encounter.   Return if symptoms worsen or fail to improve.  Terrilyn Saver, NP

## 2022-03-09 ENCOUNTER — Encounter: Payer: Self-pay | Admitting: Internal Medicine

## 2022-03-13 ENCOUNTER — Encounter (HOSPITAL_BASED_OUTPATIENT_CLINIC_OR_DEPARTMENT_OTHER): Payer: Self-pay

## 2022-03-13 ENCOUNTER — Ambulatory Visit (HOSPITAL_BASED_OUTPATIENT_CLINIC_OR_DEPARTMENT_OTHER)
Admission: RE | Admit: 2022-03-13 | Discharge: 2022-03-13 | Disposition: A | Discharge status: Home / Self Care | Payer: Medicare HMO | Source: Ambulatory Visit | Attending: Internal Medicine | Admitting: Internal Medicine

## 2022-03-13 DIAGNOSIS — Z1231 Encounter for screening mammogram for malignant neoplasm of breast: Secondary | ICD-10-CM | POA: Diagnosis not present

## 2022-03-28 DIAGNOSIS — H6993 Unspecified Eustachian tube disorder, bilateral: Secondary | ICD-10-CM | POA: Diagnosis not present

## 2022-04-10 ENCOUNTER — Ambulatory Visit (AMBULATORY_SURGERY_CENTER): Payer: Medicare HMO

## 2022-04-10 VITALS — Ht 66.0 in | Wt 160.0 lb

## 2022-04-10 DIAGNOSIS — Z86010 Personal history of colonic polyps: Secondary | ICD-10-CM

## 2022-04-10 NOTE — Progress Notes (Signed)
No egg or soy allergy known to patient  No issues known to pt with past sedation with any surgeries or procedures Patient denies ever being told they had issues or difficulty with intubation  No FH of Malignant Hyperthermia Pt is not on diet pills Pt is not on  home 02  Pt is not on blood thinners  Pt has issues with constipation  No A fib or A flutter Have any cardiac testing pending--no Pt instructed to use [email or web address] or GoodRx for a price reduction on prep

## 2022-04-18 ENCOUNTER — Other Ambulatory Visit (HOSPITAL_BASED_OUTPATIENT_CLINIC_OR_DEPARTMENT_OTHER): Payer: Self-pay

## 2022-04-18 DIAGNOSIS — Z85820 Personal history of malignant melanoma of skin: Secondary | ICD-10-CM | POA: Diagnosis not present

## 2022-04-18 DIAGNOSIS — L570 Actinic keratosis: Secondary | ICD-10-CM | POA: Diagnosis not present

## 2022-04-18 DIAGNOSIS — L821 Other seborrheic keratosis: Secondary | ICD-10-CM | POA: Diagnosis not present

## 2022-04-18 DIAGNOSIS — L578 Other skin changes due to chronic exposure to nonionizing radiation: Secondary | ICD-10-CM | POA: Diagnosis not present

## 2022-04-18 DIAGNOSIS — D225 Melanocytic nevi of trunk: Secondary | ICD-10-CM | POA: Diagnosis not present

## 2022-04-18 MED ORDER — AREXVY 120 MCG/0.5ML IM SUSR
INTRAMUSCULAR | 0 refills | Status: DC
  Filled 2022-04-18: qty 0.5, 1d supply, fill #0

## 2022-04-21 ENCOUNTER — Encounter: Payer: Self-pay | Admitting: Gastroenterology

## 2022-04-24 ENCOUNTER — Encounter: Payer: Self-pay | Admitting: Gastroenterology

## 2022-04-28 ENCOUNTER — Encounter: Payer: Self-pay | Admitting: Gastroenterology

## 2022-05-05 ENCOUNTER — Ambulatory Visit (AMBULATORY_SURGERY_CENTER): Payer: Medicare HMO | Admitting: Gastroenterology

## 2022-05-05 ENCOUNTER — Encounter: Payer: Self-pay | Admitting: Gastroenterology

## 2022-05-05 VITALS — BP 145/76 | HR 60 | Temp 99.1°F | Resp 12 | Ht 66.0 in | Wt 160.0 lb

## 2022-05-05 DIAGNOSIS — Z86010 Personal history of colonic polyps: Secondary | ICD-10-CM | POA: Diagnosis not present

## 2022-05-05 DIAGNOSIS — Z09 Encounter for follow-up examination after completed treatment for conditions other than malignant neoplasm: Secondary | ICD-10-CM

## 2022-05-05 DIAGNOSIS — D122 Benign neoplasm of ascending colon: Secondary | ICD-10-CM | POA: Diagnosis not present

## 2022-05-05 DIAGNOSIS — D125 Benign neoplasm of sigmoid colon: Secondary | ICD-10-CM

## 2022-05-05 DIAGNOSIS — E785 Hyperlipidemia, unspecified: Secondary | ICD-10-CM | POA: Diagnosis not present

## 2022-05-05 MED ORDER — SODIUM CHLORIDE 0.9 % IV SOLN: 500.0000 mL | Freq: Once | INTRAVENOUS | Status: DC

## 2022-05-05 NOTE — Progress Notes (Signed)
A and O x3. Report to RN. Tolerated MAC anesthesia well. 

## 2022-05-05 NOTE — Progress Notes (Signed)
Called to room to assist during endoscopic procedure.  Patient ID and intended procedure confirmed with present staff. Received instructions for my participation in the procedure from the performing physician.  

## 2022-05-05 NOTE — Progress Notes (Signed)
GASTROENTEROLOGY PROCEDURE H&P NOTE   Primary Care Physician: Colon Branch, MD  HPI: Kim Rogers is a 67 y.o. female who presents for Colonoscopy for surveillance colonoscopy in setting of previous colon polyps.  Past Medical History:  Diagnosis Date   Allergy    Chronic headaches    OTC med prn   GERD (gastroesophageal reflux disease)    Hyperlipidemia    Melanoma ([place])    Mitral valve prolapse    Osteoporosis    Past Surgical History:  Procedure Laterality Date   AUGMENTATION MAMMAPLASTY     bilateral breast augmentation  2012   PONV with anesthesia for this surgery per patient 12/19/18   BREAST BIOPSY Right    COLONOSCOPY  09/11/2012   polyps - dr Deatra Ina   FACIAL COSMETIC SURGERY  2012   mini face lift - same surgery with breast implants   FOOT SURGERY  1994   left   SKIN BIOPSY     pre-cancer lower right abd   TONSILLECTOMY     WISDOM TOOTH EXTRACTION     Current Outpatient Medications  Medication Sig Dispense Refill   Biotin 1000 MCG tablet Take 1,000 mcg by mouth daily.     Calcium Carbonate-Vitamin D (CALCIUM + D PO) Take by mouth 2 (two) times daily.     fish oil-omega-3 fatty acids 1000 MG capsule Take 1 g by mouth 2 (two) times daily.     Misc Natural Product Nasal (PONARIS NA) Place into the nose.     Multiple Vitamin (MULTIVITAMIN) tablet Take 1 tablet by mouth daily.     polycarbophil (FIBERCON) 625 MG tablet Take 625 mg by mouth daily.     Probiotic Product (CULTURELLE PROBIOTICS PO) Take by mouth.     omeprazole (PRILOSEC) 40 MG capsule Take 1 capsule (40 mg total) by mouth daily as needed. (Patient not taking: Reported on 04/10/2022) 90 capsule 3   Psyllium (METAMUCIL PO) Take by mouth. Takes 1 teaspoon Metamucil daily (Patient not taking: Reported on 05/05/2022)     RSV vaccine recomb adjuvanted (AREXVY) 120 MCG/0.5ML injection Inject into the muscle. (Patient not taking: Reported on 05/05/2022) 0.5 mL 0   sodium chloride (OCEAN) 0.65 % SOLN nasal  spray Place 1 spray into both nostrils as needed for congestion. (Patient not taking: Reported on 05/05/2022)     No current facility-administered medications for this visit.    Current Outpatient Medications:    Biotin 1000 MCG tablet, Take 1,000 mcg by mouth daily., Disp: , Rfl:    Calcium Carbonate-Vitamin D (CALCIUM + D PO), Take by mouth 2 (two) times daily., Disp: , Rfl:    fish oil-omega-3 fatty acids 1000 MG capsule, Take 1 g by mouth 2 (two) times daily., Disp: , Rfl:    Misc Natural Product Nasal (PONARIS NA), Place into the nose., Disp: , Rfl:    Multiple Vitamin (MULTIVITAMIN) tablet, Take 1 tablet by mouth daily., Disp: , Rfl:    polycarbophil (FIBERCON) 625 MG tablet, Take 625 mg by mouth daily., Disp: , Rfl:    Probiotic Product (CULTURELLE PROBIOTICS PO), Take by mouth., Disp: , Rfl:    omeprazole (PRILOSEC) 40 MG capsule, Take 1 capsule (40 mg total) by mouth daily as needed. (Patient not taking: Reported on 04/10/2022), Disp: 90 capsule, Rfl: 3   Psyllium (METAMUCIL PO), Take by mouth. Takes 1 teaspoon Metamucil daily (Patient not taking: Reported on 05/05/2022), Disp: , Rfl:    RSV vaccine recomb adjuvanted (AREXVY) 120 MCG/0.5ML  injection, Inject into the muscle. (Patient not taking: Reported on 05/05/2022), Disp: 0.5 mL, Rfl: 0   sodium chloride (OCEAN) 0.65 % SOLN nasal spray, Place 1 spray into both nostrils as needed for congestion. (Patient not taking: Reported on 05/05/2022), Disp: , Rfl:  No Known Allergies Family History  Problem Relation Age of Onset   Lung cancer Mother    Hypertension Brother    Colon cancer Neg Hx    Breast cancer Neg Hx    Rectal cancer Neg Hx    Stomach cancer Neg Hx    Colon polyps Neg Hx    Esophageal cancer Neg Hx    Social History   Socioeconomic History   Marital status: Married    Spouse name: 1   Number of children: Not on file   Years of education: Not on file   Highest education level: Not on file  Occupational History    Occupation: retired- Medical illustrator   Tobacco Use   Smoking status: Former    Packs/day: 0.50    Years: 5.00    Total pack years: 2.50    Types: Cigarettes    Quit date: 08/01/1976    Years since quitting: 45.7   Smokeless tobacco: Never  Vaping Use   Vaping Use: Never used  Substance and Sexual Activity   Alcohol use: Yes    Comment: occasional glass of wine   Drug use: No   Sexual activity: Yes    Birth control/protection: Post-menopausal  Other Topics Concern   Not on file  Social History Narrative   Household pt- husband   Social Determinants of Health   Financial Resource Strain: Not on file  Food Insecurity: Not on file  Transportation Needs: Not on file  Physical Activity: Not on file  Stress: Not on file  Social Connections: Not on file  Intimate Partner Violence: Not on file    Physical Exam: Today's Vitals   05/05/22 1330  BP: 112/74  Pulse: 90  Temp: 99.1 F (37.3 C)  TempSrc: Skin  SpO2: 95%  Weight: 160 lb (72.6 kg)  Height: '5\' 6"'$  (1.676 m)   Body mass index is 25.82 kg/m. GEN: NAD EYE: Sclerae anicteric ENT: MMM CV: Non-tachycardic GI: Soft, NT/ND NEURO:  Alert & Oriented x 3  Lab Results: No results for input(s): "WBC", "HGB", "HCT", "PLT" in the last 72 hours. BMET No results for input(s): "NA", "K", "CL", "CO2", "GLUCOSE", "BUN", "CREATININE", "CALCIUM" in the last 72 hours. LFT No results for input(s): "PROT", "ALBUMIN", "AST", "ALT", "ALKPHOS", "BILITOT", "BILIDIR", "IBILI" in the last 72 hours. PT/INR No results for input(s): "LABPROT", "INR" in the last 72 hours.   Impression / Plan: This is a 67 y.o.female who presents for Colonoscopy for surveillance colonoscopy in setting of previous colon polyps.  The risks and benefits of endoscopic evaluation/treatment were discussed with the patient and/or family; these include but are not limited to the risk of perforation, infection, bleeding, missed lesions, lack of diagnosis, severe  illness requiring hospitalization, as well as anesthesia and sedation related illnesses.  The patient's history has been reviewed, patient examined, no change in status, and deemed stable for procedure.  The patient and/or family is agreeable to proceed.    Justice Britain, MD [facility] # [id number]

## 2022-05-05 NOTE — Progress Notes (Signed)
Pt's states no medical or surgical changes since previsit or office visit. VS assessed by N.C 

## 2022-05-05 NOTE — Patient Instructions (Signed)
Resume all of your regular medications today.  Use fibercon daily per Dr Rush Landmark.  YOU HAD AN ENDOSCOPIC PROCEDURE TODAY AT [facility]:   Refer to the procedure report that was given to you for any specific questions about what was found during the examination.  If the procedure report does not answer your questions, please call your gastroenterologist to clarify.  If you requested that your care partner not be given the details of your procedure findings, then the procedure report has been included in a sealed envelope for you to review at your convenience later.  YOU SHOULD EXPECT: Some feelings of bloating in the abdomen. Passage of more gas than usual.  Walking can help get rid of the air that was put into your GI tract during the procedure and reduce the bloating. If you had a lower endoscopy (such as a colonoscopy or flexible sigmoidoscopy) you may notice spotting of blood in your stool or on the toilet paper. If you underwent a bowel prep for your procedure, you may not have a normal bowel movement for a few days.  Please Note:  You might notice some irritation and congestion in your nose or some drainage.  This is from the oxygen used during your procedure.  There is no need for concern and it should clear up in a day or so.  SYMPTOMS TO REPORT IMMEDIATELY:  Following lower endoscopy (colonoscopy or flexible sigmoidoscopy):  Excessive amounts of blood in the stool  Significant tenderness or worsening of abdominal pains  Swelling of the abdomen that is new, acute  Fever of 100F or higher  For urgent or emergent issues, a gastroenterologist can be reached at any hour by calling [phone]. Do not use MyChart messaging for urgent concerns.    DIET:  We do recommend a small meal at first, but then you may proceed to your regular diet.  Drink plenty of fluids but you should avoid alcoholic beverages for 24 hours.  ACTIVITY:  You should plan to take it easy for the  rest of today and you should NOT DRIVE or use heavy machinery until tomorrow (because of the sedation medicines used during the test).    FOLLOW UP: Our staff will call the number listed on your records the next business day following your procedure.  We will call around 7:15- 8:00 am to check on you and address any questions or concerns that you may have regarding the information given to you following your procedure. If we do not reach you, we will leave a message.     If any biopsies were taken you will be contacted by phone or by letter within the next 1-3 weeks.  Please call us at [phone] if you have not heard about the biopsies in 3 weeks.    SIGNATURES/CONFIDENTIALITY: You and/or your care partner have signed paperwork which will be entered into your electronic medical record.  These signatures attest to the fact that that the information above on your After Visit Summary has been reviewed and is understood.  Full responsibility of the confidentiality of this discharge information lies with you and/or your care-partner.

## 2022-05-05 NOTE — Op Note (Signed)
[facility] Patient Name: Kim Rogers Procedure Date: 05/05/2022 1:21 PM MRN: [id number] Endoscopist: Justice Britain , MD, [id number] Age: 67 Referring MD:  Date of Birth: 07-Jun-1955 Gender: Female Account #: [id number] Procedure:                Colonoscopy Indications:              Surveillance: Personal history of adenomatous                            polyps on last colonoscopy > 3 years ago Medicines:                Monitored Anesthesia Care Procedure:                Pre-Anesthesia Assessment:                           - Prior to the procedure, a History and Physical                            was performed, and patient medications and                            allergies were reviewed. The patient's tolerance of                            previous anesthesia was also reviewed. The risks                            and benefits of the procedure and the sedation                            options and risks were discussed with the patient.                            All questions were answered, and informed consent                            was obtained. Prior Anticoagulants: The patient has                            taken no anticoagulant or antiplatelet agents. ASA                            Grade Assessment: II - A patient with mild systemic                            disease. After reviewing the risks and benefits,                            the patient was deemed in satisfactory condition to                            undergo the procedure.  After obtaining informed consent, the colonoscope                            was passed under direct vision. Throughout the                            procedure, the patient's blood pressure, pulse, and                            oxygen saturations were monitored continuously. The                            CF [id number] was introduced through the anus                            and advanced to  the 3 cm into the ileum. The                            colonoscopy was performed without difficulty. The                            patient tolerated the procedure. The quality of the                            bowel preparation was good. The terminal ileum,                            ileocecal valve, appendiceal orifice, and rectum                            were photographed. Scope In: 2:59:11 PM Scope Out: 3:20:10 PM Scope Withdrawal Time: 0 hours 15 minutes 27 seconds  Total Procedure Duration: 0 hours 20 minutes 59 seconds  Findings:                 Skin tags were found on perianal exam.                           The digital rectal exam findings include                            hemorrhoids. Pertinent negatives include no                            palpable rectal lesions.                           The left colon was moderately tortuous.                           The terminal ileum and ileocecal valve appeared                            normal.  Three sessile polyps were found in the sigmoid                            colon (2) and ascending colon (1). The polyps were                            2 to 6 mm in size. These polyps were removed with a                            cold snare. Resection and retrieval were complete.                           Normal mucosa was found in the entire colon                            otherwise.                           Non-bleeding non-thrombosed external and internal                            hemorrhoids were found during retroflexion, during                            perianal exam and during digital exam. The                            hemorrhoids were Grade II (internal hemorrhoids                            that prolapse but reduce spontaneously). Complications:            No immediate complications. Estimated Blood Loss:     Estimated blood loss was minimal. Impression:               - The GI Genius (intelligent  endoscopy module),                            computer-aided polyp detection system powered by AI                            was utilized to detect colorectal polyps through                            enhanced visualization during colonoscopy.                           - Perianal skin tags found on perianal exam.                           - Hemorrhoids found on digital rectal exam.                           - Tortuous colon.                           -  The examined portion of the ileum was normal.                           - Three, 2 to 6 mm polyps in the sigmoid colon and                            in the ascending colon, removed with a cold snare.                            Resected and retrieved.                           - Normal mucosa in the entire examined colon                            otherwise.                           - Non-bleeding non-thrombosed external and internal                            hemorrhoids. Recommendation:           - The patient will be observed post-procedure,                            until all discharge criteria are met.                           - Discharge patient to home.                           - Use FiberCon 1-2 tablets PO daily.                           - Continue present medications.                           - Await pathology results.                           - Repeat colonoscopy in 3/5/7 years for                            surveillance based on pathology results.                           - The findings and recommendations were discussed                            with the patient.                           - The findings and recommendations were discussed                            with the patient's  family. Justice Britain, MD 05/05/2022 3:26:20 PM

## 2022-05-08 ENCOUNTER — Telehealth: Payer: Self-pay | Admitting: *Deleted

## 2022-05-08 NOTE — Telephone Encounter (Signed)
  Follow up Call-     05/05/2022    1:37 PM  Call back number  Post procedure Call Back phone  # [phone]  Permission to leave phone message Yes     Patient questions:  Do you have a fever, pain , or abdominal swelling? No. Pain Score  0 *  Have you tolerated food without any problems? Yes.    Have you been able to return to your normal activities? Yes.    Do you have any questions about your discharge instructions: Diet   No. Medications  No. Follow up visit  No.  Do you have questions or concerns about your Care? No.  Actions: * If pain score is 4 or above: No action needed, pain <4.

## 2022-05-10 ENCOUNTER — Encounter: Payer: Self-pay | Admitting: Gastroenterology

## 2022-05-29 ENCOUNTER — Telehealth: Payer: Self-pay

## 2022-05-29 NOTE — Telephone Encounter (Signed)
Initial Comment Caller states she has congestion and her covid test was negative. Caller states she has runny nose as well. Translation No Nurse Assessment Nurse: Dellie Catholic, RN, Cindy Date/Time (Eastern Time): 05/27/2022 1:19:43 PM Confirm and document reason for call. If symptomatic, describe symptoms. ---Caller has congestion and sinus drainage in the back of her throat . She says it started last night . Does the patient have any new or worsening symptoms? ---Yes Will a triage be completed? ---Yes Related visit to physician within the last 2 weeks? ---No Does the PT have any chronic conditions? (i.e. diabetes, asthma, this includes High risk factors for pregnancy, etc.) ---No Is this a behavioral health or substance abuse call? ---No Guidelines Guideline Title Affirmed Question Affirmed Notes Nurse Date/Time (Eastern Time) Common Cold [1] Using nasal washes and pain medicine > 24 hours AND [2] sinus pain (lower forehead, cheekbone, or eye) persists Gowie, RN, Cindy 05/27/2022 1:20:22 PM Disp. Time Eilene Ghazi Time) Disposition Final User 05/27/2022 1:21:59 PM SEE PCP WITHIN 3 DAYS Yes Gowie, RN, Cindy PLEASE NOTE: All timestamps contained within this report are represented as [place] Standard Time. CONFIDENTIALTY NOTICE: This fax transmission is intended only for the addressee. It contains information that is legally privileged, confidential or otherwise protected from use or disclosure. If you are not the intended recipient, you are strictly prohibited from reviewing, disclosing, copying using or disseminating any of this information or taking any action in reliance on or regarding this information. If you have received this fax in error, please notify us immediately by telephone so that we can arrange for its return to [place]. Phone: [phone], Toll-Free: [phone], Fax: [phone] Page: 2 of 2 Call Id: [id number] Final Disposition 05/27/2022 1:21:59 PM SEE PCP WITHIN 3 DAYS Yes  Dellie Catholic, RN, [place] Disagree/Comply Comply Caller Understands Yes PreDisposition Did not know what to do Care Advice Given Per Guideline SEE PCP WITHIN 3 DAYS: * You need to be seen within 2 or 3 days. HUMIDIFIER: TREATMENT FOR OTHER COLD SYMPTOMS: * For muscle aches, headaches, or moderate fever (more than 101 F or 38.3 C): Take acetaminophen every 4 hours. * Sore throat: Try throat lozenges, hard candy, or warm chicken broth. * Cough: Use cough drops. * Hydrate: Drink adequate liquids. CALL BACK IF: * Fever over 104 F (40 C) * You have difficulty breathing * You become worse CARE ADVICE given per Common Cold (Adult) guideline

## 2022-05-29 NOTE — Telephone Encounter (Signed)
LMOM informing Pt to call office and schedule appt if still needed.

## 2022-06-05 ENCOUNTER — Telehealth: Payer: Self-pay | Admitting: *Deleted

## 2022-06-05 NOTE — Telephone Encounter (Signed)
Who Is Calling Patient / Member / Family / Caregiver Call Type Triage / Clinical Relationship To Patient Self Return Phone Number [phone] (Primary) Chief Complaint Runny or Stuffy Nose Reason for Call Symptomatic / Request for Health Information Initial Comment Caller states tested pos for COVID 19; sneezing, runny nose and chest congestion, body aches; Meds? Translation No Nurse Assessment Nurse: Omar Person, RN, Cristal Deer Date/Time (Eastern Time): 06/03/2022 11:50:47 AM Confirm and document reason for call. If symptomatic, describe symptoms. ---Caller states tested positive for COVID 19 this morning. Has been developing symptoms since Tuesday: sneeze, HA, runny nose, cough, body aches, and chest congestion. Denies fever, other symptoms.   Final Disposition 06/03/2022 12:03:02 PM See HCP within 4 Hours (or PCP triage)

## 2022-06-05 NOTE — Telephone Encounter (Signed)
Spoke w/ Pt- called to check on her- she is actually feeling much better. Informed to call if she becomes worse. Pt verbalized understanding.

## 2022-06-12 ENCOUNTER — Encounter: Payer: Self-pay | Admitting: Family Medicine

## 2022-06-12 ENCOUNTER — Ambulatory Visit (INDEPENDENT_AMBULATORY_CARE_PROVIDER_SITE_OTHER): Payer: Medicare HMO | Admitting: Family Medicine

## 2022-06-12 VITALS — BP 128/70 | HR 70 | Temp 97.8°F | Ht 66.0 in | Wt 161.0 lb

## 2022-06-12 DIAGNOSIS — J0140 Acute pansinusitis, unspecified: Secondary | ICD-10-CM

## 2022-06-12 MED ORDER — AMOXICILLIN-POT CLAVULANATE 875-125 MG PO TABS: 1.0000 | ORAL_TABLET | Freq: Two times a day (BID) | ORAL | 0 refills | Status: DC

## 2022-06-12 MED ORDER — BENZONATATE 100 MG PO CAPS: 100.0000 mg | ORAL_CAPSULE | Freq: Two times a day (BID) | ORAL | 0 refills | Status: DC | PRN

## 2022-06-12 MED ORDER — HYDROCOD POLI-CHLORPHE POLI ER 10-8 MG/5ML PO SUER: 5.0000 mL | Freq: Two times a day (BID) | ORAL | 0 refills | Status: AC | PRN

## 2022-06-12 NOTE — Progress Notes (Signed)
Acute Office Visit  Subjective:     Patient ID: Kim Rogers, female    DOB: June 21, 1955, 67 y.o.   MRN: [id number]  Chief Complaint  Patient presents with   Cough     Patient is in today for cough, sinus pressure.   Kim Rogers was COVID positive 9 days ago, she has been quarantined since that time. States that towards the end of last week she was feeling much better, but over the weekend symptoms worsened again. She has been having a productive cough, sinus pressure, headache, body aches, postnasal drainage. She denies fevers, chills, chest pain, dyspnea, GI/GU symptoms. She has tried Mucinex, Excedrin and Sudafed with minimal improvement. Her cough is the worst symptom - keeping her from sleeping.      All review of systems negative except what is listed in the HPI      Objective:    BP 128/70   Pulse 70   Temp 97.8 F (36.6 C) (Oral)   Ht 5\' 6"  (1.676 m)   Wt 161 lb (73 kg)   LMP  (LMP Unknown)   SpO2 97%   BMI 25.99 kg/m    Physical Exam Vitals reviewed.  Constitutional:      Appearance: Normal appearance.  HENT:     Head: Normocephalic and atraumatic.     Nose: Congestion and rhinorrhea present.     Mouth/Throat:     Pharynx: No oropharyngeal exudate or posterior oropharyngeal erythema.  Eyes:     Conjunctiva/sclera: Conjunctivae normal.  Cardiovascular:     Rate and Rhythm: Normal rate and regular rhythm.     Pulses: Normal pulses.     Heart sounds: Normal heart sounds.  Pulmonary:     Effort: Pulmonary effort is normal.     Breath sounds: Normal breath sounds. No wheezing, rhonchi or rales.  Musculoskeletal:     Cervical back: Normal range of motion and neck supple. No tenderness.  Lymphadenopathy:     Cervical: No cervical adenopathy.  Skin:    General: Skin is warm and dry.  Neurological:     Mental Status: She is alert and oriented to person, place, and time.  Psychiatric:        Mood and Affect: Mood normal.        Behavior: Behavior  normal.        Thought Content: Thought content normal.        Judgment: Judgment normal.     No results found for any visits on 06/12/22.      Assessment & Plan:   Problem List Items Addressed This Visit   None Visit Diagnoses     Acute non-recurrent pansinusitis    -  Primary   Relevant Medications   benzonatate (TESSALON) 100 MG capsule   chlorpheniramine-HYDROcodone (TUSSIONEX) 10-8 MG/5ML   amoxicillin-clavulanate (AUGMENTIN) 875-125 MG tablet      Try a few days of supportive measures. If not improving, start antibiotics. Sending in cough syrup and pills.   Continue supportive measures including rest, hydration, humidifier use, steam showers, warm compresses to sinuses, warm liquids with lemon and honey, and over-the-counter cough, cold, and analgesics as needed.   Meds ordered this encounter  Medications   benzonatate (TESSALON) 100 MG capsule    Sig: Take 1 capsule (100 mg total) by mouth 2 (two) times daily as needed for cough.    Dispense:  20 capsule    Refill:  0    Order Specific Question:   Supervising Provider  Answer:   Danise Edge A [[id number]]   chlorpheniramine-HYDROcodone (TUSSIONEX) 10-8 MG/5ML    Sig: Take 5 mLs by mouth every 12 (twelve) hours as needed for up to 5 days.    Dispense:  50 mL    Refill:  0    Order Specific Question:   Supervising Provider    Answer:   Danise Edge A [[id number]]   amoxicillin-clavulanate (AUGMENTIN) 875-125 MG tablet    Sig: Take 1 tablet by mouth 2 (two) times daily.    Dispense:  20 tablet    Refill:  0    Order Specific Question:   Supervising Provider    Answer:   Danise Edge A [[id number]]    Return if symptoms worsen or fail to improve.  Clayborne Dana, NP

## 2022-06-12 NOTE — Patient Instructions (Addendum)
Try a few days of supportive measures. If not improving, start antibiotics. Sending in cough syrup and pills.   Continue supportive measures including rest, hydration, humidifier use, steam showers, warm compresses to sinuses, warm liquids with lemon and honey, and over-the-counter cough, cold, and analgesics as needed.   The following information is provided as a Counsellor for ADULT patients only and does NOT take into account PREGNANCY, ALLERGIES, LIVER CONDITIONS, KIDNEY CONDITIONS, GASTROINTESTINAL CONDITIONS, OR PRESCRIPTION MEDICATION INTERACTIONS. Please be sure to ask your provider if the following are safe to take with your specific medical history, conditions, or current medication regimen if you are unsure.   Adult Basic Symptom Management for Sinusitis  Congestion: Guaifenesin (Mucinex)- follow directions on packaging with a maximum dose of 2400mg  in a 24 hour period.  Pain/Fever: Ibuprofen 200mg  - 400mg  every 4-6 hours as needed (MAX 1200mg  in a 24 hour period) Pain/Fever: Tylenol 500mg  -1000mg  every 6-8 hours as needed (MAX 3000mg  in a 24 hour period)  Cough: Dextromethorphan (Delsym)- follow directions on packing with a maximum dose of 120mg  in a 24 hour period.  Nasal Stuffiness: Saline nasal spray and/or Nettie Pot with sterile saline solution  Runny Nose: Fluticasone nasal spray (Flonase) OR Mometasone nasal spray (Nasonex) OR Triamcinolone Acetonide nasal spray (Nasacort)- follow directions on the packaging  Pain/Pressure: Warm washcloth to the face  Sore Throat: Warm salt water gargles  If you have allergies, you may also consider taking an oral antihistamine (like Zyrtec or Claritin) as these may also help with your symptoms.  **Many medications will have more than one ingredient, be sure you are reading the packaging carefully and not taking more than one dose of the same kind of medication at the same time or too close together. It is OK to use formulas that  have all of the ingredients you want, but do not take them in a combined medication and as separate dose too close together. If you have any questions, the pharmacist will be happy to help you decide what is safe.

## 2022-08-22 ENCOUNTER — Telehealth: Payer: Self-pay

## 2022-08-22 ENCOUNTER — Encounter: Payer: Self-pay | Admitting: Internal Medicine

## 2022-08-22 MED ORDER — PROMETHAZINE HCL 12.5 MG RE SUPP: 12.5000 mg | Freq: Three times a day (TID) | RECTAL | 0 refills | Status: DC | PRN

## 2022-08-22 NOTE — Telephone Encounter (Signed)
PA approved.   Your request has been approved Authorization Expiration Date: 02/27/2023 

## 2022-08-22 NOTE — Telephone Encounter (Signed)
PA initiated via Covermymeds; KEY: [id number]. Awaiting determination.

## 2022-11-08 DIAGNOSIS — D225 Melanocytic nevi of trunk: Secondary | ICD-10-CM | POA: Diagnosis not present

## 2022-11-08 DIAGNOSIS — Z85820 Personal history of malignant melanoma of skin: Secondary | ICD-10-CM | POA: Diagnosis not present

## 2022-11-08 DIAGNOSIS — L821 Other seborrheic keratosis: Secondary | ICD-10-CM | POA: Diagnosis not present

## 2022-11-08 DIAGNOSIS — L570 Actinic keratosis: Secondary | ICD-10-CM | POA: Diagnosis not present

## 2022-11-15 ENCOUNTER — Other Ambulatory Visit (HOSPITAL_BASED_OUTPATIENT_CLINIC_OR_DEPARTMENT_OTHER): Payer: Self-pay

## 2022-11-15 MED ORDER — INFLUENZA VAC A&B SURF ANT ADJ 0.5 ML IM SUSY
0.5000 mL | PREFILLED_SYRINGE | Freq: Once | INTRAMUSCULAR | 0 refills | Status: AC
  Filled 2022-11-15: qty 0.5, 1d supply, fill #0

## 2022-11-15 MED ORDER — COVID-19 MRNA VAC-TRIS(PFIZER) 30 MCG/0.3ML IM SUSY
0.3000 mL | PREFILLED_SYRINGE | Freq: Once | INTRAMUSCULAR | 0 refills | Status: AC
  Filled 2022-11-15: qty 0.3, 1d supply, fill #0

## 2022-11-24 IMAGING — MG DIGITAL SCREENING BREAST BILAT IMPLANT W/ TOMO W/ CAD
8 of 12 series · 8 of 28 positions shown · non-contrast
Comparison: Previous exam(s).

CLINICAL DATA: Screening.

EXAM:
DIGITAL SCREENING BILATERAL MAMMOGRAM WITH IMPLANTS, CAD AND
TOMOSYNTHESIS
TECHNIQUE: Bilateral screening digital craniocaudal and mediolateral oblique
mammograms were obtained. Bilateral screening digital breast
tomosynthesis was performed. The images were evaluated with
computer-aided detection. Standard and/or implant displaced views
were performed.

[L MLO]
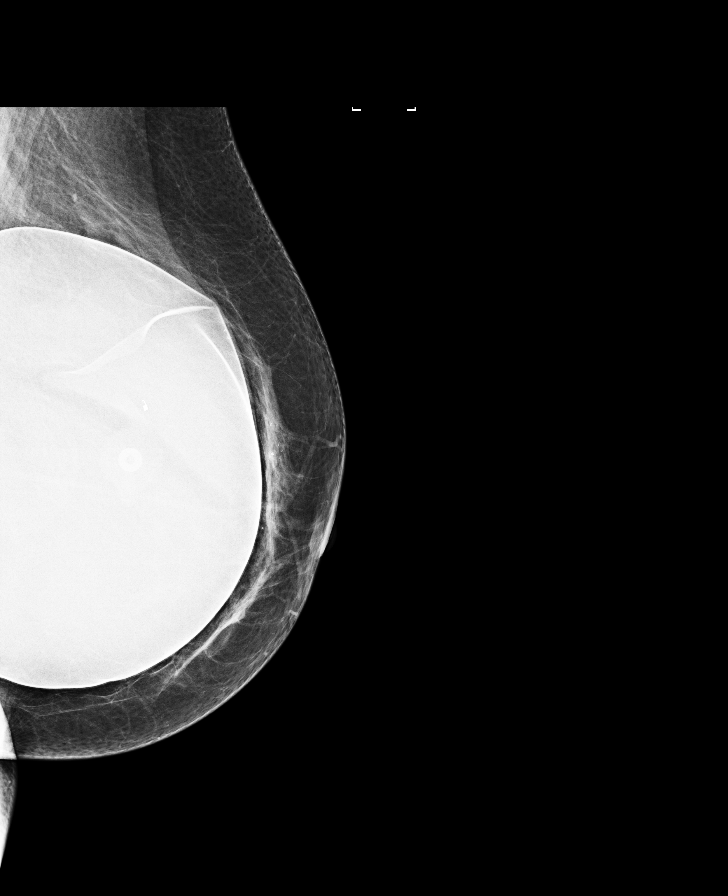

[R CC]
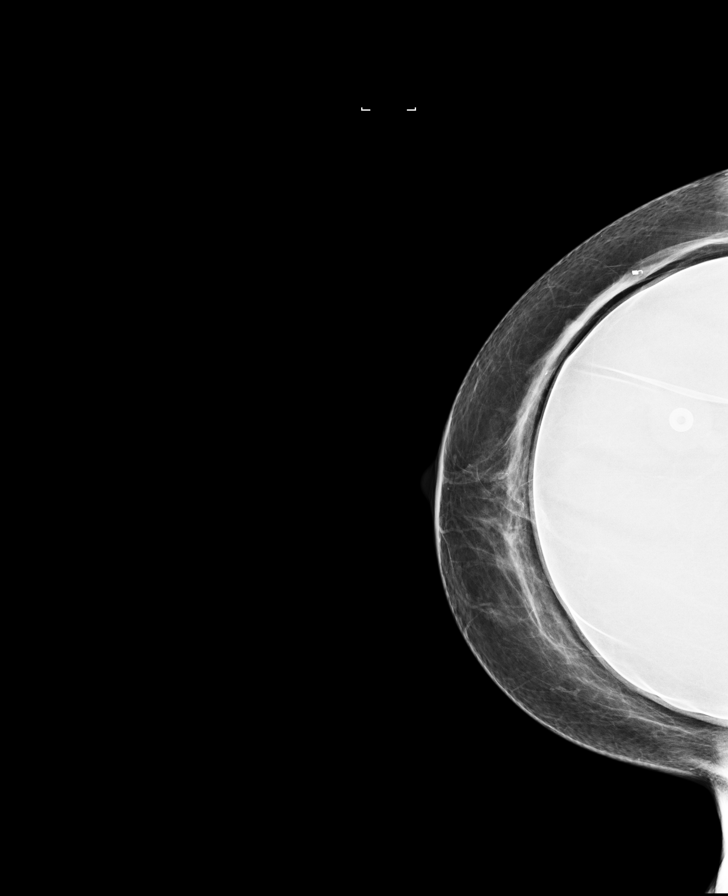

[L CC]
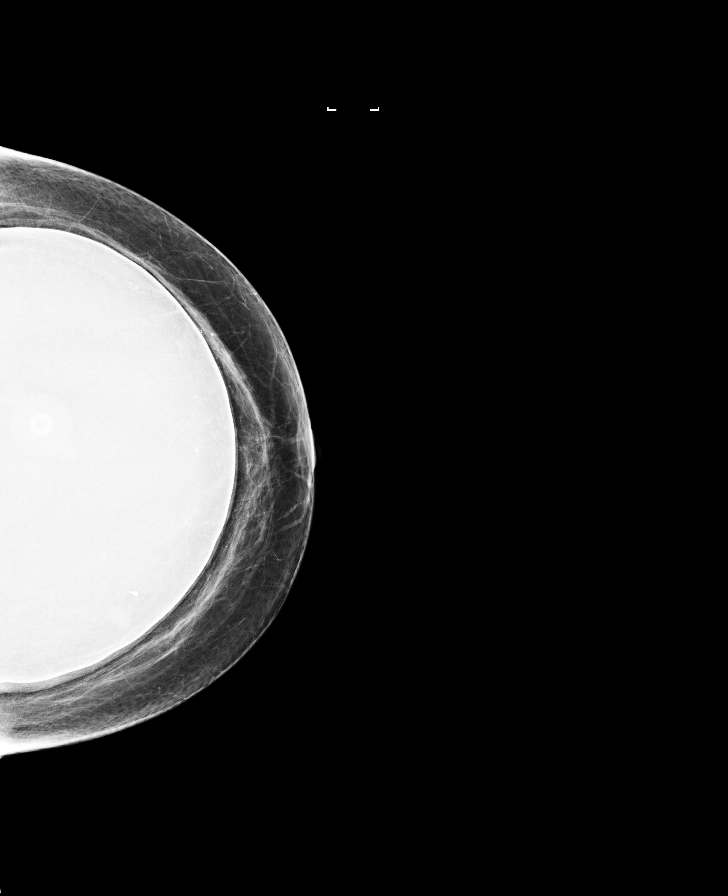

[R MLO]
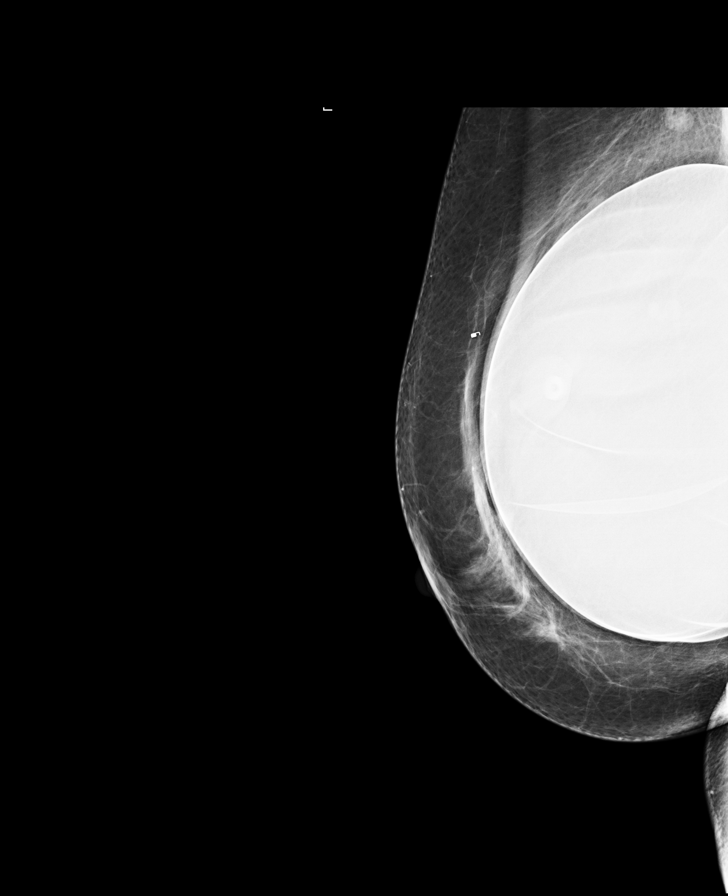

[R MLO synth-2D]
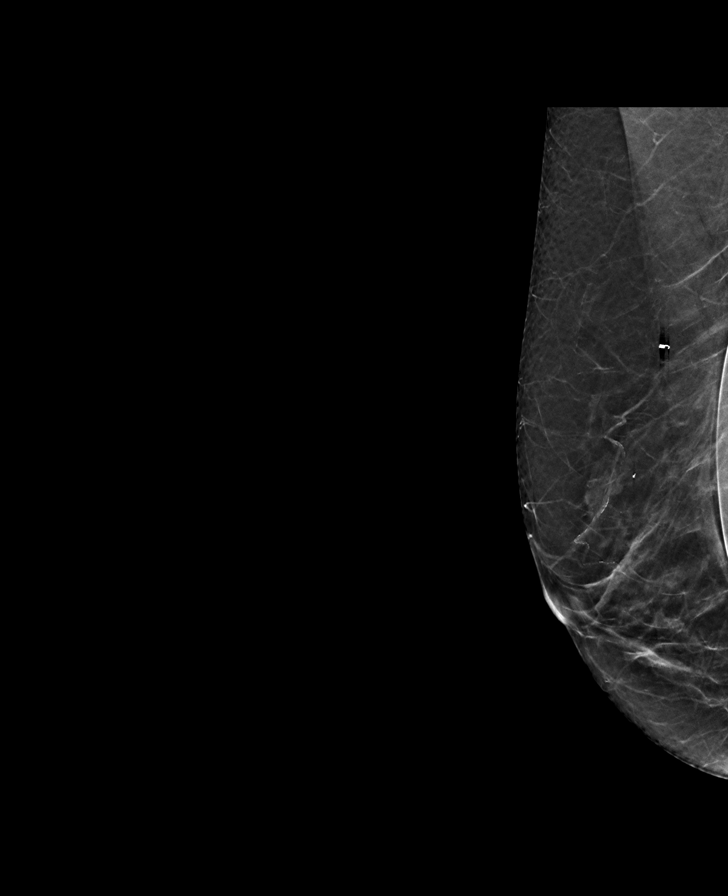

[L CC synth-2D]
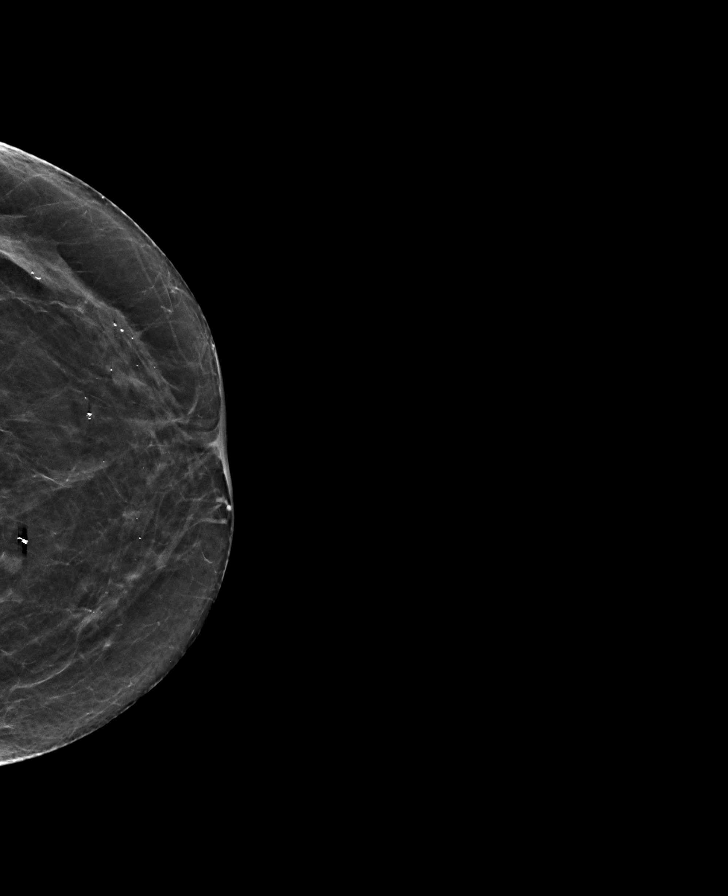

[R CC synth-2D]
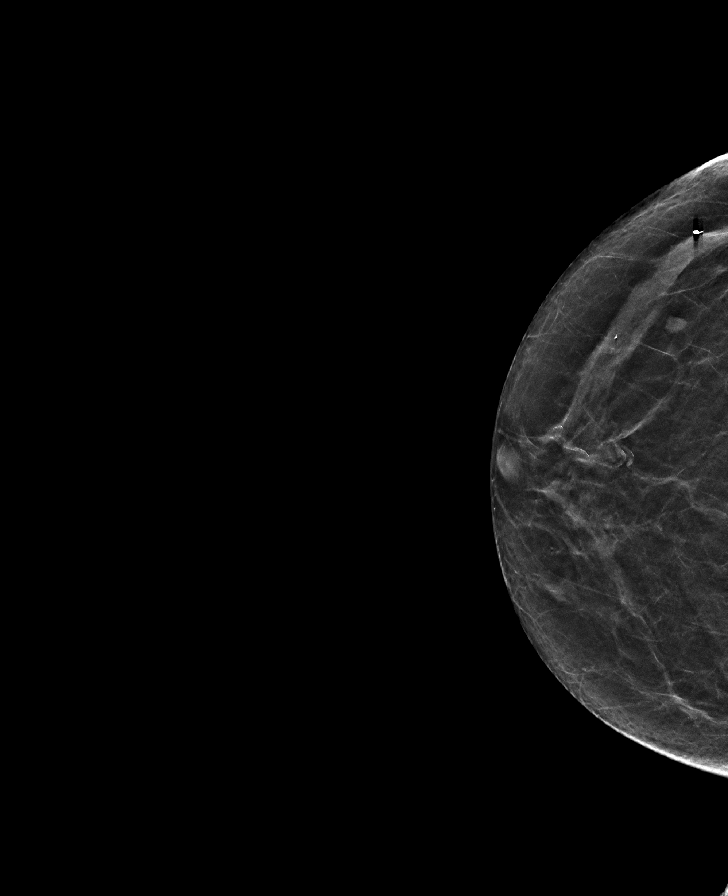

[L MLO synth-2D]
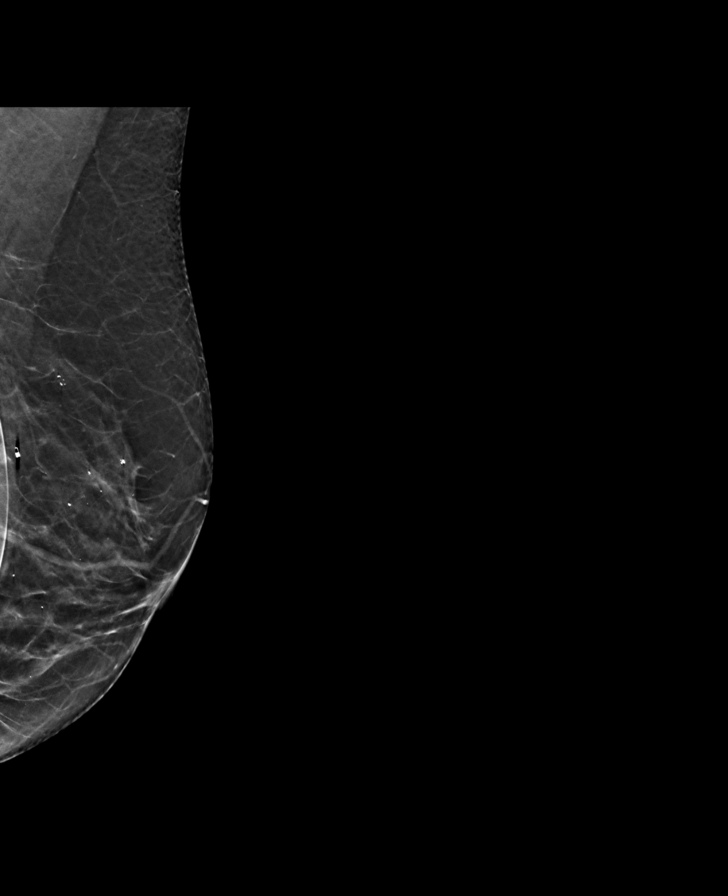

[8 of 28 positions shown; findings below may reference images not displayed]

ACR Breast Density Category b: There are scattered areas of
fibroglandular density.
FINDINGS: The patient has retropectoral implants. There are no findings
suspicious for malignancy.
IMPRESSION: No mammographic evidence of malignancy. A result letter of this
screening mammogram will be mailed directly to the patient.

RECOMMENDATION:
Screening mammogram in one year. (Code:SE-S-JMG)

BI-RADS CATEGORY  1:  Negative.

## 2023-03-02 ENCOUNTER — Other Ambulatory Visit (HOSPITAL_BASED_OUTPATIENT_CLINIC_OR_DEPARTMENT_OTHER): Payer: Self-pay | Admitting: Internal Medicine

## 2023-03-02 DIAGNOSIS — Z1231 Encounter for screening mammogram for malignant neoplasm of breast: Secondary | ICD-10-CM

## 2023-03-05 ENCOUNTER — Encounter: Payer: Self-pay | Admitting: Internal Medicine

## 2023-03-05 ENCOUNTER — Ambulatory Visit (INDEPENDENT_AMBULATORY_CARE_PROVIDER_SITE_OTHER): Payer: PPO | Admitting: Internal Medicine

## 2023-03-05 VITALS — BP 108/70 | HR 67 | Temp 98.2°F | Resp 16 | Ht 66.0 in | Wt 159.2 lb

## 2023-03-05 DIAGNOSIS — M810 Age-related osteoporosis without current pathological fracture: Secondary | ICD-10-CM | POA: Diagnosis not present

## 2023-03-05 DIAGNOSIS — E785 Hyperlipidemia, unspecified: Secondary | ICD-10-CM | POA: Diagnosis not present

## 2023-03-05 DIAGNOSIS — Z0000 Encounter for general adult medical examination without abnormal findings: Secondary | ICD-10-CM

## 2023-03-05 DIAGNOSIS — Z8249 Family history of ischemic heart disease and other diseases of the circulatory system: Secondary | ICD-10-CM | POA: Diagnosis not present

## 2023-03-05 DIAGNOSIS — E559 Vitamin D deficiency, unspecified: Secondary | ICD-10-CM | POA: Diagnosis not present

## 2023-03-05 DIAGNOSIS — R931 Abnormal findings on diagnostic imaging of heart and coronary circulation: Secondary | ICD-10-CM

## 2023-03-05 DIAGNOSIS — Z0001 Encounter for general adult medical examination with abnormal findings: Secondary | ICD-10-CM | POA: Diagnosis not present

## 2023-03-05 LAB — COMPREHENSIVE METABOLIC PANEL
ALT: 14 U/L (ref 0–35)
AST: 15 U/L (ref 0–37)
Albumin: 4.7 g/dL (ref 3.5–5.2)
Alkaline Phosphatase: 108 U/L (ref 39–117)
BUN: 21 mg/dL (ref 6–23)
CO2: 27 meq/L (ref 19–32)
Calcium: 9.7 mg/dL (ref 8.4–10.5)
Chloride: 101 meq/L (ref 96–112)
Creatinine, Ser: 0.86 mg/dL (ref 0.40–1.20)
GFR: 68.66 mL/min (ref >60.00)
Glucose, Bld: 87 mg/dL (ref 70–99)
Potassium: 3.9 meq/L (ref 3.5–5.1)
Sodium: 138 meq/L (ref 135–145)
Total Bilirubin: 0.7 mg/dL (ref 0.2–1.2)
Total Protein: 7.3 g/dL (ref 6.0–8.3)

## 2023-03-05 LAB — CBC WITH DIFFERENTIAL/PLATELET
Basophils Absolute: 0 10*3/uL (ref 0.0–0.1)
Basophils Relative: 0.8 % (ref 0.0–3.0)
Eosinophils Absolute: 0.1 10*3/uL (ref 0.0–0.7)
Eosinophils Relative: 1.9 % (ref 0.0–5.0)
HCT: 44.5 % (ref 36.0–46.0)
Hemoglobin: 14.6 g/dL (ref 12.0–15.0)
Lymphocytes Relative: 37.2 % (ref 12.0–46.0)
Lymphs Abs: 2.3 10*3/uL (ref 0.7–4.0)
MCHC: 32.8 g/dL (ref 30.0–36.0)
MCV: 91.3 fL (ref 78.0–100.0)
Monocytes Absolute: 0.5 10*3/uL (ref 0.1–1.0)
Monocytes Relative: 8.5 % (ref 3.0–12.0)
Neutro Abs: 3.2 10*3/uL (ref 1.4–7.7)
Neutrophils Relative %: 51.6 % (ref 43.0–77.0)
Platelets: 375 10*3/uL (ref 150.0–400.0)
RBC: 4.87 Mil/uL (ref 3.87–5.11)
RDW: 13.5 % (ref 11.5–15.5)
WBC: 6.3 10*3/uL (ref 4.0–10.5)

## 2023-03-05 LAB — TSH: TSH: 1.29 u[IU]/mL (ref 0.35–5.50)

## 2023-03-05 LAB — LIPID PANEL
Cholesterol: 246 mg/dL — ABNORMAL HIGH (ref 0–200)
HDL: 72.8 mg/dL (ref >39.00)
LDL Cholesterol: 161 mg/dL — ABNORMAL HIGH (ref 0–99)
NonHDL: 173.04
Total CHOL/HDL Ratio: 3
Triglycerides: 61 mg/dL (ref 0.0–149.0)
VLDL: 12.2 mg/dL (ref 0.0–40.0)

## 2023-03-05 LAB — VITAMIN D 25 HYDROXY (VIT D DEFICIENCY, FRACTURES): VITD: 33.45 ng/mL (ref 30.00–100.00)

## 2023-03-05 NOTE — Progress Notes (Signed)
 Subjective:    Patient ID: Kim Rogers, female    DOB: 1955/10/01, 68 y.o.   MRN: [id number]  DOS:  03/05/2023 Type of visit - description: cpx  Here for CPX Feels well. Since last year, her brother had a CABG. The patient is concerned about the issue but is asymptomatic, exercises regularly w/o chest pain or difficulty breathing.   Review of Systems  Other than above, a 14 point review of systems is negative     Past Medical History:  Diagnosis Date   Allergy    Chronic headaches    OTC med prn   GERD (gastroesophageal reflux disease)    Hyperlipidemia    Melanoma (HCC)    Mitral valve prolapse    Osteoporosis     Past Surgical History:  Procedure Laterality Date   AUGMENTATION MAMMAPLASTY     bilateral breast augmentation  2012   PONV with anesthesia for this surgery per patient 12/19/18   BREAST BIOPSY Right    COLONOSCOPY  09/11/2012   polyps - dr debrah   FACIAL COSMETIC SURGERY  2012   mini face lift - same surgery with breast implants   FOOT SURGERY  1994   left   SKIN BIOPSY     pre-cancer lower right abd   TONSILLECTOMY     WISDOM TOOTH EXTRACTION     Social History   Socioeconomic History   Marital status: Married    Spouse name: 1   Number of children: Not on file   Years of education: Not on file   Highest education level: Not on file  Occupational History   Occupation: retired- advertising account planner   Tobacco Use   Smoking status: Former    Current packs/day: 0.00    Average packs/day: 0.5 packs/day for 5.0 years (2.5 ttl pk-yrs)    Types: Cigarettes    Start date: 08/02/1971    Quit date: 08/01/1976    Years since quitting: 46.6   Smokeless tobacco: Never  Vaping Use   Vaping status: Never Used  Substance and Sexual Activity   Alcohol use: Yes    Comment: occasional glass of wine   Drug use: No   Sexual activity: Yes    Birth control/protection: Post-menopausal  Other Topics Concern   Not on file  Social History Narrative    Household pt- husband   Social Drivers of Corporate Investment Banker Strain: Not on file  Food Insecurity: Not on file  Transportation Needs: Not on file  Physical Activity: Not on file  Stress: Not on file  Social Connections: Not on file  Intimate Partner Violence: Not on file    Current Outpatient Medications  Medication Instructions   amoxicillin -clavulanate (AUGMENTIN ) 875-125 MG tablet 1 tablet, Oral, 2 times daily   Biotin 1,000 mcg, Daily   Calcium  Carbonate-Vitamin D  (CALCIUM  + D PO) 2 times daily   fish oil-omega-3 fatty acids 1 g, 2 times daily   Misc Natural Product Nasal (PONARIS NA) Place into the nose.   Multiple Vitamin (MULTIVITAMIN) tablet 1 tablet, Daily   omeprazole  (PRILOSEC) 40 mg, Oral, Daily PRN   polycarbophil (FIBERCON) 625 mg, Daily   Probiotic Product (CULTURELLE PROBIOTICS PO) Take by mouth.   Psyllium (METAMUCIL PO) Take by mouth. Takes 1 teaspoon Metamucil daily   sodium chloride  (OCEAN) 0.65 % SOLN nasal spray 1 spray, As needed       Objective:   Physical Exam BP 108/70   Pulse 67   Temp 98.2  F (36.8 C) (Oral)   Resp 16   Ht 5' 6 (1.676 m)   Wt 159 lb 4 oz (72.2 kg)   LMP  (LMP Unknown)   SpO2 98%   BMI 25.70 kg/m  General: Well developed, NAD, BMI noted Neck: No  thyromegaly  HEENT:  Normocephalic . Face symmetric, atraumatic Lungs:  CTA B Normal respiratory effort, no intercostal retractions, no accessory muscle use. Heart: RRR,  no murmur.  Abdomen:  Not distended, soft, non-tender. No rebound or rigidity.   Lower extremities: no pretibial edema bilaterally  Skin: Exposed areas without rash. Not pale. Not jaundice Neurologic:  alert & oriented X3.  Speech normal, gait appropriate for age and unassisted Strength symmetric and appropriate for age.  Psych: Cognition and judgment appear intact.  Cooperative with normal attention span and concentration.  Behavior appropriate. No anxious or depressed appearing.      Assessment    ASSESSMENT new patient 11/2018 referred by her husband, previous PCP retired Osteoporosis T score -3.0 (01/10/2013) GERD, meds PRN Allergies  H/o MVP H/o remote migraines, occ HAs Melanoma per pt, skin lesions, sees derm  PLAN Here for CPX Tdap: 2020 S/p shingrix  x2; RSV 2024 PNM 23: 10-2018; Prevnar 13: 2021 Had a flu shot and a recent COVID-vaccine. CCS: Had a colonoscopy   12-2018, C-scope 05/05/2022.  Next per GI. Female care: Will return to gyn prn, currently asx.last  PAP 12-2018  ([facility]) [facility] January 2024 Labs: CMP FLP CBC TSH vitamin D  Lifestyle: Remains active, goes to the gym, room for improvement on diet. Osteoporosis, vitamin D  deficiency: Last T-score -2.9, January 2023, current treatment is vitamin D , exercise.  Again discussed the benefits of treatment.  Recommend DEXA but she would like to proceed next year.  Good compliance with vitamin D  supplements, check levels. FH CAD: Her brother had a cardiac arrest and a triple CABG recently, age 16.  Patient concerned about the issue. Is ASX. Calculated 10 years cardiovascular risk is 6.3%, last LDL 162. EKG today: Sinus rhythm, discussed with cardiology. Plan: Checking labs, very low threshold to start statins but states she will be reluctant to take meds thus will get a coronary calcium  score.  RTC 4 months

## 2023-03-05 NOTE — Patient Instructions (Addendum)
      GO TO THE LAB : Get the blood work     Next visit with me in 4 months    Please schedule it at the front desk

## 2023-03-06 NOTE — Assessment & Plan Note (Signed)
 Here for CPX Tdap: 2020 S/p shingrix  x2; RSV 2024 PNM 23: 10-2018; Prevnar 13: 2021 Had a flu shot and a recent COVID-vaccine. CCS: Had a colonoscopy   12-2018, C-scope 05/05/2022.  Next per GI. Female care: Will return to gyn prn, currently asx.last  PAP 12-2018  ([facility]) MMG January 2024 Labs: CMP FLP CBC TSH vitamin D  Lifestyle: Remains active, goes to the gym, room for improvement on diet.

## 2023-03-06 NOTE — Assessment & Plan Note (Signed)
 Here for CPX  Osteoporosis, vitamin D  deficiency: Last T-score -2.9, January 2023, current treatment is vitamin D , exercise.  Again discussed the benefits of treatment.  Recommend DEXA but she would like to proceed next year.  Good compliance with vitamin D  supplements, check levels. FH CAD: Her brother had a cardiac arrest and a triple CABG recently, age 70.  Patient concerned about the issue. Is ASX. Calculated 10 years cardiovascular risk is 6.3%, last LDL 162. EKG today: Sinus rhythm, discussed with cardiology. Plan: Checking labs, very low threshold to start statins but states she will be reluctant to take meds thus will get a coronary calcium  score.  RTC 4 months

## 2023-03-22 ENCOUNTER — Ambulatory Visit (HOSPITAL_BASED_OUTPATIENT_CLINIC_OR_DEPARTMENT_OTHER): Payer: PPO

## 2023-03-22 ENCOUNTER — Inpatient Hospital Stay (HOSPITAL_BASED_OUTPATIENT_CLINIC_OR_DEPARTMENT_OTHER): Admission: RE | Admit: 2023-03-22 | Payer: Medicare HMO | Source: Ambulatory Visit

## 2023-03-26 ENCOUNTER — Ambulatory Visit (HOSPITAL_BASED_OUTPATIENT_CLINIC_OR_DEPARTMENT_OTHER)
Admission: RE | Admit: 2023-03-26 | Discharge: 2023-03-26 | Disposition: A | Discharge status: Home / Self Care | Payer: PPO | Source: Ambulatory Visit | Attending: Internal Medicine | Admitting: Internal Medicine

## 2023-03-26 ENCOUNTER — Encounter (HOSPITAL_BASED_OUTPATIENT_CLINIC_OR_DEPARTMENT_OTHER): Payer: Self-pay

## 2023-03-26 DIAGNOSIS — Z1231 Encounter for screening mammogram for malignant neoplasm of breast: Secondary | ICD-10-CM | POA: Diagnosis not present

## 2023-03-26 DIAGNOSIS — Z8249 Family history of ischemic heart disease and other diseases of the circulatory system: Secondary | ICD-10-CM | POA: Insufficient documentation

## 2023-03-27 MED ORDER — ROSUVASTATIN CALCIUM 20 MG PO TABS: 20.0000 mg | ORAL_TABLET | Freq: Every day | ORAL | 0 refills | Status: DC

## 2023-03-27 NOTE — Addendum Note (Signed)
Addended by: Conrad Slinger D on: 03/27/2023 01:00 PM   Modules accepted: Orders

## 2023-04-02 ENCOUNTER — Encounter: Payer: Self-pay | Admitting: Internal Medicine

## 2023-04-11 ENCOUNTER — Other Ambulatory Visit: Payer: Self-pay | Admitting: Internal Medicine

## 2023-04-11 ENCOUNTER — Encounter: Payer: Self-pay | Admitting: Internal Medicine

## 2023-04-11 MED ORDER — SCOPOLAMINE 1 MG/3DAYS TD PT72: 1.0000 | MEDICATED_PATCH | TRANSDERMAL | 0 refills | Status: DC

## 2023-04-12 MED ORDER — PROMETHAZINE HCL 12.5 MG RE SUPP: 12.5000 mg | Freq: Three times a day (TID) | RECTAL | 0 refills | Status: DC | PRN

## 2023-04-12 NOTE — Addendum Note (Signed)
Addended by: Willow Ora E on: 04/12/2023 02:07 PM   Modules accepted: Orders

## 2023-04-16 ENCOUNTER — Other Ambulatory Visit (HOSPITAL_BASED_OUTPATIENT_CLINIC_OR_DEPARTMENT_OTHER): Payer: Self-pay

## 2023-05-16 ENCOUNTER — Other Ambulatory Visit (INDEPENDENT_AMBULATORY_CARE_PROVIDER_SITE_OTHER): Payer: PPO

## 2023-05-16 DIAGNOSIS — E785 Hyperlipidemia, unspecified: Secondary | ICD-10-CM | POA: Diagnosis not present

## 2023-05-16 LAB — LIPID PANEL
Cholesterol: 164 mg/dL (ref 0–200)
HDL: 72.5 mg/dL (ref >39.00)
LDL Cholesterol: 74 mg/dL (ref 0–99)
NonHDL: 91.5
Total CHOL/HDL Ratio: 2
Triglycerides: 86 mg/dL (ref 0.0–149.0)
VLDL: 17.2 mg/dL (ref 0.0–40.0)

## 2023-05-16 LAB — ALT: ALT: 21 U/L (ref 0–35)

## 2023-05-16 LAB — AST: AST: 20 U/L (ref 0–37)

## 2023-05-17 DIAGNOSIS — C439 Malignant melanoma of skin, unspecified: Secondary | ICD-10-CM | POA: Insufficient documentation

## 2023-05-17 DIAGNOSIS — E785 Hyperlipidemia, unspecified: Secondary | ICD-10-CM | POA: Insufficient documentation

## 2023-05-17 DIAGNOSIS — T7840XA Allergy, unspecified, initial encounter: Secondary | ICD-10-CM | POA: Insufficient documentation

## 2023-05-17 DIAGNOSIS — I341 Nonrheumatic mitral (valve) prolapse: Secondary | ICD-10-CM | POA: Insufficient documentation

## 2023-05-17 DIAGNOSIS — R519 Other chronic pain: Secondary | ICD-10-CM | POA: Insufficient documentation

## 2023-05-17 DIAGNOSIS — H318 Other specified disorders of choroid: Secondary | ICD-10-CM | POA: Diagnosis not present

## 2023-05-17 DIAGNOSIS — H25093 Other age-related incipient cataract, bilateral: Secondary | ICD-10-CM | POA: Diagnosis not present

## 2023-05-17 DIAGNOSIS — K219 Gastro-esophageal reflux disease without esophagitis: Secondary | ICD-10-CM | POA: Insufficient documentation

## 2023-05-18 ENCOUNTER — Encounter: Payer: Self-pay | Admitting: Internal Medicine

## 2023-05-18 MED ORDER — ROSUVASTATIN CALCIUM 20 MG PO TABS: 20.0000 mg | ORAL_TABLET | Freq: Every day | ORAL | 3 refills | Status: DC

## 2023-05-18 NOTE — Addendum Note (Signed)
 Addended byConrad Noxon D on: 05/18/2023 02:38 PM   Modules accepted: Orders

## 2023-05-23 ENCOUNTER — Ambulatory Visit: Payer: PPO

## 2023-05-23 VITALS — BP 136/78 | HR 63 | Ht 66.0 in | Wt 160.1 lb

## 2023-05-23 DIAGNOSIS — I2510 Atherosclerotic heart disease of native coronary artery without angina pectoris: Secondary | ICD-10-CM | POA: Insufficient documentation

## 2023-05-23 DIAGNOSIS — I341 Nonrheumatic mitral (valve) prolapse: Secondary | ICD-10-CM

## 2023-05-23 DIAGNOSIS — E782 Mixed hyperlipidemia: Secondary | ICD-10-CM | POA: Diagnosis not present

## 2023-05-23 MED ORDER — ATORVASTATIN CALCIUM 10 MG PO TABS: 10.0000 mg | ORAL_TABLET | ORAL | 3 refills | Status: DC

## 2023-05-23 MED ORDER — COENZYME Q10 30 MG PO CAPS
30.0000 mg | ORAL_CAPSULE | Freq: Three times a day (TID) | ORAL | Status: AC
Start: 2023-05-23 — End: ?

## 2023-05-23 MED ORDER — ASPIRIN 81 MG PO TBEC: 81.0000 mg | DELAYED_RELEASE_TABLET | Freq: Every day | ORAL | Status: AC

## 2023-05-23 NOTE — Patient Instructions (Addendum)
 Check your blood pressure at home. If It remains 130/80 or higher, let us know.    Medication Instructions:   Stop Crestor  Start Atorvastatin 10 mg three days a week. (Monday, Wednesday, Friday)  Start Aspirin 81 mg once a day   Start CoQ10 over the counter as directed.    *If you need a refill on your cardiac medications before your next appointment, please call your pharmacy*   Lab Work: None Ordered If you have labs (blood work) drawn today and your tests are completely normal, you will receive your results only by: MyChart Message (if you have MyChart) OR A paper copy in the mail If you have any lab test that is abnormal or we need to change your treatment, we will call you to review the results.   Testing/Procedures: None Ordered   Follow-Up: At [facility], you and your health needs are our priority.  As part of our continuing mission to provide you with exceptional heart care, we have created designated Provider Care Teams.  These Care Teams include your primary Cardiologist (physician) and Advanced Practice Providers (APPs -  Physician Assistants and Nurse Practitioners) who all work together to provide you with the care you need, when you need it.  We recommend signing up for the patient portal called "MyChart".  Sign up information is provided on this After Visit Summary.  MyChart is used to connect with patients for Virtual Visits (Telemedicine).  Patients are able to view lab/test results, encounter notes, upcoming appointments, etc.  Non-urgent messages can be sent to your provider as well.   To learn more about what you can do with MyChart, go to [email or web address].    Your next appointment:   1 year

## 2023-05-23 NOTE — Assessment & Plan Note (Signed)
 Lipid panel showed good response to rosuvastatin as noted below.  However she is unable to tolerate rosuvastatin 20 mg every day. Will switch to atorvastatin 10 mg 3 times a week for the first couple months and titrate up the dose as tolerated.

## 2023-05-23 NOTE — Progress Notes (Signed)
 Cardiology Consultation:    Date:  05/23/2023   ID:  Kim Rogers, Kim Rogers [birth year], MRN [id number]  PCP:  Kim Plump, MD  Cardiologist:  Marlyn Corporal Aaryanna Hyden, MD   Referring MD: Kim Plump, MD   No chief complaint on file.    ASSESSMENT AND PLAN:   Ms. Giarrusso 68 year old woman with no significant chronic health issues in the past recently noted with elevated coronary calcium score 446 in January 2025, hyperlipidemia, did not tolerate rosuvastatin 20 mg once daily due to muscle aches and quit about 4 days ago.  Mention of mitral valve prolapse in her chart as noted below based on physical exam findings by her gynecologist and no ultrasound exams.  I do not hear any heart murmurs on physical exam today.  I do not see any reason for an echocardiogram at this time.  I will take this off her problem list.   Problem List Items Addressed This Visit     Hyperlipidemia   Lipid panel showed good response to rosuvastatin as noted below.  However she is unable to tolerate rosuvastatin 20 mg every day. Will switch to atorvastatin 10 mg 3 times a week for the first couple months and titrate up the dose as tolerated.        Relevant Medications   atorvastatin (LIPITOR) 10 MG tablet   co-enzyme Q-10 30 MG capsule   aspirin EC 81 MG tablet   Coronary atherosclerosis, calcium score 446 January 2025 - Primary   Asymptomatic. Good functional status. Reviewed findings, nature of hard versus soft plaque in the assessment with calcium score of the hard plaque.  Its role in screening for heart disease reviewed.  Also discussed the nonspecific nature of assessment for coronary artery disease with this study.  Since she is asymptomatic with good functional status no further functional or anatomical testing being pursued at this time.  Discussed further risk reduction strategies. In the setting role of statins not just lowering lipid levels but also reducing overall cardiovascular risk  discussed.  Since she did not tolerate rosuvastatin we can try an alternate statin at a lower dose and escalate the dose as tolerated slowly was discussed.  She is agreeable. In this context we will start atorvastatin 10 mg 3 days a week. If tolerating well over the next 2 months, titrate up the dose to 5 times a week and subsequently similarly in another 2 months can titrated up to once a day. Advised to take co-Q10 supplements over-the-counter in addition with statins.  Reviewed role of aspirin 81 mg once daily and reducing cardiovascular disease risk and its potential for side effects with bleeding. She takes Excedrin on a routine basis for migraine. Advised her to start taking aspirin 81 mg once daily and can hold off taking it on the days she takes Excedrin.         Relevant Medications   atorvastatin (LIPITOR) 10 MG tablet   co-enzyme Q-10 30 MG capsule   aspirin EC 81 MG tablet   Other Relevant Orders   EKG 12-Lead (Completed)   Return to clinic tentatively in 1 year.   History of Present Illness:    Kim Rogers is a 68 y.o. female who is being seen today for the evaluation of coronary atherosclerosis and elevated calcium score at the request of Kim Plump, MD.   Coronary calcium score was 446 on the study from January 2025, it also showed aortic atherosclerosis, hyperlipidemia.  No other  significant chronic health issues. There was mention of mitral valve prolapse in her chart when asked about it she mentions no prior ultrasounds for the heart done.  This was apparently mentioned to her by a gynecologist on physical exam and no further follow-up had been done over 15 years ago. Older brother had cardiac arrest recently and she wanted to be proactive with her health.  Pleasant woman here for the visit by herself.  Lives with her husband at home.  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  Keeps herself busy with regular exercise at the gym and line  dancing.  Has no significant physical symptoms.  Denies any chest pain, shortness of breath, orthopnea paroxysmal nocturnal dyspnea.  No pedal edema.  Since her calcium score study she started taking rosuvastatin in February and took it for about 4 to 5 weeks.  Few days into taking statins she started having muscle aches and soreness.  Eventually about 4 days ago she quit taking Crestor.  Does not smoke. Social alcohol consumption on occasions. No recreational or illicit drug use.  Does not check blood pressures at home routinely.  At PCPs office and other health screenings typically well-controlled.  Mildly elevated today in the office.  EKG in the clinic today shows sinus rhythm heart rate 63/min, anteroseptal Q waves, normal QRS axis, duration 72 ms.  Normal PR interval 166 ms.  Normal QTc 470 ms.  Blood work from 05-16-2023 total cholesterol 164, triglycerides 86, HDL 72, LDL 74. In comparison prior lipid panel from 03/05/2023 noted total cholesterol 246, triglycerides 61, HDL 72, LDL 161.   Normal transaminases Prior TSH from 03/05/2023 normal 1.29 CBC from 03/05/2023 unremarkable hemoglobin 14.6, hematocrit 44.5.   Past Medical History:  Diagnosis Date   Allergic rhinitis 08/15/2012   Allergy    Annual physical exam 12/03/2018   Chronic headaches    OTC med prn   Colon polyp 12/17/2015   Seen on 09/12/12 colonoscopy by [facility], per pt.  5y f/u.  Tubular adenaoma  Seen on 09/12/12 colonoscopy by [facility], per pt.  5y f/u.  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  46-month history of nasal congestive symptoms worse at night.  Denies infectious sounding symptoms of colored rhinorrhea with facial pressure or fever.  She has tried antihistamines and decongestants with only partial relief.  It affects her ability to rest well at night.  EXAM shows nasal septum in the midline.  No polyps or puru   Osteoporosis    PCP NOTES >>>>>>>>>>>>>> 12/03/2018    Past Surgical History:  Procedure Laterality Date   AUGMENTATION MAMMAPLASTY     bilateral breast augmentation  2012   PONV with anesthesia for this surgery per patient 12/19/18   BREAST BIOPSY Right    COLONOSCOPY  09/11/2012   polyps - dr Arlyce Dice   FACIAL COSMETIC SURGERY  2012   mini face lift - same surgery with breast implants   FOOT SURGERY  1994   left  SKIN BIOPSY     pre-cancer lower right abd   TONSILLECTOMY     WISDOM TOOTH EXTRACTION      Current Medications: Current Meds  Medication Sig   aspirin EC 81 MG tablet Take 1 tablet (81 mg total) by mouth daily. Swallow whole.   atorvastatin (LIPITOR) 10 MG tablet Take 1 tablet (10 mg total) by mouth 3 (three) times a week.   Biotin 1000 MCG tablet Take 1,000 mcg by mouth daily.   Calcium Carbonate-Vitamin D (CALCIUM + D PO) Take by mouth 2 (two) times daily.   co-enzyme Q-10 30 MG capsule Take 1 capsule (30 mg total) by mouth 3 (three) times daily.   fish oil-omega-3 fatty acids 1000 MG capsule Take 1 g by mouth 2 (two) times daily.   Misc Natural Product Nasal (PONARIS NA) Place into the nose.   Multiple Vitamin (MULTIVITAMIN) tablet Take 1 tablet by mouth daily.   omeprazole (PRILOSEC) 40 MG capsule Take 1 capsule (40 mg total) by mouth daily as needed.   polycarbophil (FIBERCON) 625 MG tablet Take 625 mg by mouth daily.   Probiotic Product (CULTURELLE PROBIOTICS PO) Take by mouth.   promethazine (PHENERGAN) 12.5 MG suppository Place 1 suppository (12.5 mg total) rectally every 8 (eight) hours as needed for nausea or  vomiting.   Psyllium (METAMUCIL PO) Take by mouth. Takes 1 teaspoon Metamucil daily   sodium chloride (OCEAN) 0.65 % SOLN nasal spray Place 1 spray into both nostrils as needed for congestion.   [DISCONTINUED] rosuvastatin (CRESTOR) 20 MG tablet Take 1 tablet (20 mg total) by mouth at bedtime.     Allergies:   Patient has no known allergies.   Social History   Socioeconomic History   Marital status: Married    Spouse name: 1   Number of children: Not on file   Years of education: Not on file   Highest education level: Not on file  Occupational History   Occupation: retired- Advertising account planner   Tobacco Use   Smoking status: Former    Current packs/day: 0.00    Average packs/day: 0.5 packs/day for 5.0 years (2.5 ttl pk-yrs)    Types: Cigarettes    Start date: 08/02/1971    Quit date: 08/01/1976    Years since quitting: 46.8   Smokeless tobacco: Never  Vaping Use   Vaping status: Never Used  Substance and Sexual Activity   Alcohol use: Yes    Comment: occasional glass of wine   Drug use: No   Sexual activity: Yes    Birth control/protection: Post-menopausal  Other Topics Concern   Not on file  Social History Narrative   Household pt- husband   Social Drivers of Corporate investment banker Strain: Not on file  Food Insecurity: Not on file  Transportation Needs: Not on file  Physical Activity: Not on file  Stress: Not on file  Social Connections: Not on file     Family History: The patient's family history includes CAD in her brother; Hypertension in her brother; Lung cancer in her mother. There is no history of Colon cancer, Breast cancer, Rectal cancer, Stomach cancer, Colon polyps, or Esophageal cancer. ROS:   Please see the history of present illness.    All 14 point review of systems negative except as described per history of present illness.  EKGs/Labs/Other Studies Reviewed:    The following studies were reviewed today:   EKG:  EKG  Interpretation Date/Time:  Wednesday May 23 2023 09:42:29 EDT Ventricular  Rate:  63 PR Interval:  166 QRS Duration:  72 QT Interval:  408 QTC Calculation: 417 R Axis:   56  Text Interpretation: Normal sinus rhythm Septal infarct , age undetermined No previous ECGs available Confirmed by Huntley Dec reddy [phone]) on 05/23/2023 9:47:48 AM    Recent Labs: 03/05/2023: BUN 21; Creatinine, Ser 0.86; Hemoglobin 14.6; Platelets 375.0; Potassium 3.9; Sodium 138; TSH 1.29 05/16/2023: ALT 21  Recent Lipid Panel    Component Value Date/Time   CHOL 164 05/16/2023 0740   TRIG 86.0 05/16/2023 0740   HDL 72.50 05/16/2023 0740   CHOLHDL 2 05/16/2023 0740   VLDL 17.2 05/16/2023 0740   LDLCALC 74 05/16/2023 0740   LDLCALC 155 (H) 12/16/2019 1405    Physical Exam:    VS:  BP 136/78   Pulse 63   Ht 5\' 6"  (1.676 m)   Wt 160 lb 1.9 oz (72.6 kg)   LMP  (LMP Unknown)   SpO2 95%   BMI 25.84 kg/m     Wt Readings from Last 3 Encounters:  05/23/23 160 lb 1.9 oz (72.6 kg)  03/05/23 159 lb 4 oz (72.2 kg)  06/12/22 161 lb (73 kg)     GENERAL:  Well nourished, well developed in no acute distress NECK: No JVD; No carotid bruits CARDIAC: RRR, S1 and S2 present, no murmurs, no rubs, no gallops CHEST:  Clear to auscultation without rales, wheezing or rhonchi  Extremities: No pitting pedal edema. Pulses bilaterally symmetric with radial 2+ and dorsalis pedis 2+ NEUROLOGIC:  Alert and oriented x 3  Medication Adjustments/Labs and Tests Ordered: Current medicines are reviewed at length with the patient today.  Concerns regarding medicines are outlined above.  Orders Placed This Encounter  Procedures   EKG 12-Lead   Meds ordered this encounter  Medications   atorvastatin (LIPITOR) 10 MG tablet    Sig: Take 1 tablet (10 mg total) by mouth 3 (three) times a week.    Dispense:  40 tablet    Refill:  3   co-enzyme Q-10 30 MG capsule    Sig: Take 1 capsule (30 mg total) by mouth 3 (three)  times daily.   aspirin EC 81 MG tablet    Sig: Take 1 tablet (81 mg total) by mouth daily. Swallow whole.    Signed, Cecille Amsterdam, MD, MPH, [facility]. 05/23/2023 10:23 AM    [facility]

## 2023-05-23 NOTE — Assessment & Plan Note (Signed)
 Asymptomatic. Good functional status. Reviewed findings, nature of hard versus soft plaque in the assessment with calcium score of the hard plaque.  Its role in screening for heart disease reviewed.  Also discussed the nonspecific nature of assessment for coronary artery disease with this study.  Since she is asymptomatic with good functional status no further functional or anatomical testing being pursued at this time.  Discussed further risk reduction strategies. In the setting role of statins not just lowering lipid levels but also reducing overall cardiovascular risk discussed.  Since she did not tolerate rosuvastatin we can try an alternate statin at a lower dose and escalate the dose as tolerated slowly was discussed.  She is agreeable. In this context we will start atorvastatin 10 mg 3 days a week. If tolerating well over the next 2 months, titrate up the dose to 5 times a week and subsequently similarly in another 2 months can titrated up to once a day. Advised to take co-Q10 supplements over-the-counter in addition with statins.  Reviewed role of aspirin 81 mg once daily and reducing cardiovascular disease risk and its potential for side effects with bleeding. She takes Excedrin on a routine basis for migraine. Advised her to start taking aspirin 81 mg once daily and can hold off taking it on the days she takes Excedrin.

## 2023-05-28 DIAGNOSIS — L720 Epidermal cyst: Secondary | ICD-10-CM | POA: Diagnosis not present

## 2023-05-28 DIAGNOSIS — L821 Other seborrheic keratosis: Secondary | ICD-10-CM | POA: Diagnosis not present

## 2023-06-15 DIAGNOSIS — L237 Allergic contact dermatitis due to plants, except food: Secondary | ICD-10-CM | POA: Diagnosis not present

## 2023-06-15 DIAGNOSIS — H5789 Other specified disorders of eye and adnexa: Secondary | ICD-10-CM | POA: Diagnosis not present

## 2023-06-21 DIAGNOSIS — H40013 Open angle with borderline findings, low risk, bilateral: Secondary | ICD-10-CM | POA: Diagnosis not present

## 2023-06-21 DIAGNOSIS — H25813 Combined forms of age-related cataract, bilateral: Secondary | ICD-10-CM | POA: Diagnosis not present

## 2023-06-21 DIAGNOSIS — H43813 Vitreous degeneration, bilateral: Secondary | ICD-10-CM | POA: Diagnosis not present

## 2023-06-21 DIAGNOSIS — H16141 Punctate keratitis, right eye: Secondary | ICD-10-CM | POA: Diagnosis not present

## 2023-08-03 ENCOUNTER — Ambulatory Visit: Payer: PPO | Admitting: Internal Medicine

## 2023-08-20 ENCOUNTER — Ambulatory Visit (INDEPENDENT_AMBULATORY_CARE_PROVIDER_SITE_OTHER): Admitting: Internal Medicine

## 2023-08-20 VITALS — BP 126/80 | HR 56 | Temp 98.1°F | Resp 16 | Ht 66.0 in | Wt 160.4 lb

## 2023-08-20 DIAGNOSIS — I2510 Atherosclerotic heart disease of native coronary artery without angina pectoris: Secondary | ICD-10-CM

## 2023-08-20 DIAGNOSIS — E782 Mixed hyperlipidemia: Secondary | ICD-10-CM

## 2023-08-20 MED ORDER — SCOPOLAMINE 1 MG/3DAYS TD PT72: 1.0000 | MEDICATED_PATCH | TRANSDERMAL | 0 refills | Status: DC

## 2023-08-20 NOTE — Patient Instructions (Signed)
   GO TO THE LAB :  Get the blood work   Your results will be posted on MyChart with my comments  Next office visit for a physical exam by 02/2024 Please make an appointment before you leave today

## 2023-08-20 NOTE — Progress Notes (Unsigned)
 Subjective:    Patient ID: Kim Rogers, female    DOB: December 21, 1955, 68 y.o.   MRN: [id number]  DOS:  08/20/2023 Type of visit - description: Routine follow-up  Since the last office visit, had a coronary calcium  score and saw cardiology.  Notes reviewed.  Doing great, no chest pain or difficulty breathing.  No edema.   Review of Systems See above   Past Medical History:  Diagnosis Date   Allergic rhinitis 08/15/2012   Allergy    Annual physical exam 12/03/2018   Chronic headaches    OTC med prn   Colon polyp 12/17/2015   Seen on 09/12/12 colonoscopy by [facility], per pt.  5y f/u.  Tubular adenaoma  Seen on 09/12/12 colonoscopy by [facility], per pt.  5y f/u.  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  72-month history of nasal congestive symptoms worse at night.  Denies infectious sounding symptoms of colored rhinorrhea with facial pressure or fever.  She has tried antihistamines and decongestants with only partial relief.  It affects her ability to rest well at night.  EXAM shows nasal septum in the midline.  No polyps or puru   Osteoporosis    PCP NOTES >>>>>>>>>>>>>> 12/03/2018    Past Surgical History:  Procedure Laterality Date   AUGMENTATION MAMMAPLASTY     bilateral breast augmentation  2012   PONV with anesthesia for this surgery per patient 12/19/18   BREAST BIOPSY Right    COLONOSCOPY  09/11/2012   polyps - dr debrah   FACIAL COSMETIC SURGERY  2012   mini face lift - same surgery with breast implants   FOOT SURGERY  1994   left   SKIN  BIOPSY     pre-cancer lower right abd   TONSILLECTOMY     WISDOM TOOTH EXTRACTION      Current Outpatient Medications  Medication Instructions   aspirin  EC 81 mg, Oral, Daily, Swallow whole.   atorvastatin  (LIPITOR) 10 MG tablet Take 1 tablet by mouth 5 times per week   Biotin 1,000 mcg, Daily   Calcium  Carbonate-Vitamin D  (CALCIUM  + D PO) 2 times daily   co-enzyme Q-10 30 mg, Oral, 3 times daily   fish oil-omega-3 fatty acids 1 g, 2 times daily   Misc Natural Product Nasal (PONARIS NA) Place into the nose.   Multiple Vitamin (MULTIVITAMIN) tablet 1 tablet, Daily   omeprazole  (PRILOSEC) 40 mg, Oral, Daily PRN   polycarbophil (FIBERCON) 625 mg, Daily   Probiotic Product (CULTURELLE PROBIOTICS PO) Take by mouth.   promethazine  (PHENERGAN ) 12.5 mg, Rectal, Every 8 hours PRN   Psyllium (METAMUCIL PO) Take by mouth. Takes 1 teaspoon Metamucil daily   sodium chloride  (OCEAN) 0.65 % SOLN nasal spray 1 spray, As needed       Objective:   Physical Exam BP 126/80   Pulse (!) 56   Temp 98.1 F (36.7 C) (Oral)   Resp 16   Ht 5' 6 (1.676 m)   Wt 160 lb  6 oz (72.7 kg)   LMP  (LMP Unknown)   SpO2 98%   BMI 25.89 kg/m  General:   Well developed, NAD, BMI noted. HEENT:  Normocephalic . Face symmetric, atraumatic Lungs:  CTA B Normal respiratory effort, no intercostal retractions, no accessory muscle use. Heart: RRR,  no murmur.  Lower extremities: no pretibial edema bilaterally  Skin: Not pale. Not jaundice Neurologic:  alert & oriented X3.  Speech normal, gait appropriate for age and unassisted Psych--  Cognition and judgment appear intact.  Cooperative with normal attention span and concentration.  Behavior appropriate. No anxious or depressed appearing.      Assessment    ASSESSMENT new patient 11/2018 referred by her husband, previous PCP retired Osteoporosis T score -3.0 (01/10/2013) GERD, meds PRN FH CAD, elevated coronary calcium  score (02/2023)  Melanoma per pt,  skin lesions, sees derm Allergies  H/o MVP H/o remote migraines, occ HAs   PLAN FH CAD, elevated coronary calcium  score Coronary calcium  score 03/26/2023: 446, percentile 91. I recommended rosuvastatin  and cardiology evaluation. Saw cardiology March 2025, they noted improvement on FLP but she was intolerant to Crestor , was recommended atorvastatin  and  aspirin  81 mg. Atorvastatin  increased from 3 times a week to 5 times a week and next month she plans to take qd. Plan: Continue her very healthy lifestyle, check AST ALT FLP. Seasickness: Requesting refill on scopolamine , has taken before without side effects.  Rx sent. RTC 02-2024 CPX

## 2023-08-21 LAB — LIPID PANEL
Cholesterol: 174 mg/dL (ref 0–200)
HDL: 69.6 mg/dL (ref >39.00)
LDL Cholesterol: 89 mg/dL (ref 0–99)
NonHDL: 103.9
Total CHOL/HDL Ratio: 2
Triglycerides: 74 mg/dL (ref 0.0–149.0)
VLDL: 14.8 mg/dL (ref 0.0–40.0)

## 2023-08-21 LAB — ALT: ALT: 19 U/L (ref 0–35)

## 2023-08-21 LAB — AST: AST: 18 U/L (ref 0–37)

## 2023-08-21 NOTE — Assessment & Plan Note (Signed)
 FH CAD, elevated coronary calcium  score Coronary calcium  score 03/26/2023: 446, percentile 91. I recommended rosuvastatin  and cardiology evaluation. Saw cardiology March 2025, they noted improvement on FLP but she was intolerant to Crestor , was recommended atorvastatin  and  aspirin  81 mg. Atorvastatin  increased from 3 times a week to 5 times a week and next month she plans to take qd. Plan: Continue her very healthy lifestyle, check AST ALT FLP. Seasickness: Requesting refill on scopolamine , has taken before without side effects.  Rx sent. RTC 02-2024 CPX

## 2023-08-22 ENCOUNTER — Ambulatory Visit: Payer: Self-pay | Admitting: Internal Medicine

## 2023-09-24 DIAGNOSIS — H25041 Posterior subcapsular polar age-related cataract, right eye: Secondary | ICD-10-CM | POA: Diagnosis not present

## 2023-09-24 DIAGNOSIS — H04123 Dry eye syndrome of bilateral lacrimal glands: Secondary | ICD-10-CM | POA: Diagnosis not present

## 2023-09-27 ENCOUNTER — Other Ambulatory Visit: Payer: Self-pay | Admitting: Internal Medicine

## 2023-09-27 NOTE — Telephone Encounter (Signed)
 Crestor  20mg  no longer on med list. Rx denied.

## 2023-10-02 ENCOUNTER — Telehealth: Payer: Self-pay

## 2023-10-02 DIAGNOSIS — E782 Mixed hyperlipidemia: Secondary | ICD-10-CM

## 2023-10-02 DIAGNOSIS — I2510 Atherosclerotic heart disease of native coronary artery without angina pectoris: Secondary | ICD-10-CM

## 2023-10-02 NOTE — Telephone Encounter (Signed)
*  STAT* If patient is at the pharmacy, call can be transferred to refill team.   1. Which medications need to be refilled? (please list name of each medication and dose if known)  atorvastatin  (LIPITOR) 10 MG tablet once daily   2. Which pharmacy/location (including street and city if local pharmacy) is medication to be sent to? [facility] - [place], [place] - [address]   3. Do they need a 30 day or 90 day supply?  90 day supply

## 2023-10-03 ENCOUNTER — Telehealth: Payer: Self-pay

## 2023-10-03 ENCOUNTER — Other Ambulatory Visit: Payer: Self-pay

## 2023-10-03 DIAGNOSIS — E782 Mixed hyperlipidemia: Secondary | ICD-10-CM

## 2023-10-03 DIAGNOSIS — I2510 Atherosclerotic heart disease of native coronary artery without angina pectoris: Secondary | ICD-10-CM

## 2023-10-03 MED ORDER — ATORVASTATIN CALCIUM 10 MG PO TABS: 10.0000 mg | ORAL_TABLET | Freq: Every day | ORAL | 3 refills | Status: DC

## 2023-10-03 MED ORDER — ATORVASTATIN CALCIUM 10 MG PO TABS
ORAL_TABLET | ORAL | 3 refills | Status: DC
Start: 2023-10-03 — End: 2023-10-03

## 2023-10-03 NOTE — Telephone Encounter (Signed)
 Pharmacy calling back in about this rx. The rx says Take 1 tablet (10 mg total) by mouth daily. Take 1 tablet by mouth 5 times per week.   She states she needs to either say take one by mouth daily or take one by mouth 5 times per week. It cannot say both. Please advise.     atorvastatin  (LIPITOR) 10 MG tablet

## 2023-10-03 NOTE — Telephone Encounter (Signed)
 RX sent in

## 2023-10-03 NOTE — Telephone Encounter (Signed)
 Pt aware that RX has been sent in corrected.

## 2023-10-03 NOTE — Telephone Encounter (Signed)
 Pt c/o medication issue:  1. Name of Medication: atorvastatin  (LIPITOR) 10 MG tablet   2. How are you currently taking this medication (dosage and times per day)? Pt said she is taking it 7 times a week  3. Are you having a reaction (difficulty breathing--STAT)? No   4. What is your medication issue? It was sent in for 5 times a week and her insurance will not pay for it until 8/17 because they don't have that she is taking 7 times a week.

## 2023-10-31 ENCOUNTER — Ambulatory Visit: Payer: Self-pay | Admitting: *Deleted

## 2023-10-31 ENCOUNTER — Ambulatory Visit (INDEPENDENT_AMBULATORY_CARE_PROVIDER_SITE_OTHER): Admitting: Family

## 2023-10-31 VITALS — BP 139/73 | HR 82 | Temp 98.8°F | Resp 16 | Ht 66.0 in | Wt 160.0 lb

## 2023-10-31 DIAGNOSIS — J069 Acute upper respiratory infection, unspecified: Secondary | ICD-10-CM | POA: Diagnosis not present

## 2023-10-31 NOTE — Patient Instructions (Signed)
Please call if symptoms worsen or if not improved in 3-4 days.

## 2023-10-31 NOTE — Telephone Encounter (Addendum)
 Appt scheduled today with other provider. None available with PCP.      Copied from CRM [id number]. Topic: Clinical - Red Word Triage >> Oct 31, 2023  8:07 AM [place] A wrote: [facility] that prompted transfer to Nurse Triage: Patient has body aches, fever and chills. Answer Assessment - Initial Assessment Questions 1. TEMPERATURE: What is the most recent temperature?  How was it measured?      99.2 this am 2. ONSET: When did the fever start?      SUnday 3. CHILLS: Do you have chills? If yes: How bad are they?  (e.g., none, mild, moderate, severe)     Yes chills not severe 4. OTHER SYMPTOMS: Do you have any other symptoms besides the fever?  (e.g., abdomen pain, cough, diarrhea, earache, headache, sore throat, urination pain)     Headache, body aches 5. CAUSE: If there are no symptoms, ask: What do you think is causing the fever?      Na  6. CONTACTS: Does anyone else in the family have an infection?     No  7. TREATMENT: What have you done so far to treat this fever? (e.g., OTC fever medicines)     tylenol 8. IMMUNOCOMPROMISE: Do you have any of the following: diabetes, HIV positive, splenectomy, cancer chemotherapy, chronic steroid treatment, transplant patient, etc.?     Na  9. PREGNANCY: Is there any chance you are pregnant? When was your last menstrual period?     Na  10. TRAVEL: Have you traveled out of the country in the last month? (e.g., travel history, exposures)       Na  Protocols used: [facility]

## 2023-10-31 NOTE — Telephone Encounter (Signed)
 FYI Only or Action Required?: FYI only for provider.  Patient was last seen in primary care on 08/20/2023 by Amon Aloysius BRAVO, MD.  Called Nurse Triage reporting Fever.  Symptoms began several days ago.  Interventions attempted: OTC medications: tylenol and Rest, hydration, or home remedies.  Symptoms are: gradually worsening.  Triage Disposition: No disposition on file.  Patient/caregiver understands and will follow disposition?: yes appt scheduled for today .     Reason for Disposition  Fever present > 3 days (72 hours)    Low grade fever  Protocols used: Fever-A-AH

## 2023-10-31 NOTE — Assessment & Plan Note (Signed)
 New.  Covid/Flu swabs both negative. Recommended supportive care (tylenol prn, fluids, rest). Recommended retest at home for covid in 3 days.  Call if +. Call if new/worsening symptoms or if symptoms are not improved in 3-4 days.

## 2023-10-31 NOTE — Progress Notes (Signed)
 Subjective:     Patient ID: Kim Rogers, female    DOB: May 25, 1955, 68 y.o.   MRN: [id number]  Chief Complaint  Patient presents with   Generalized Body Aches    Patient reports body aches since Sunday   Fever    Patient reports fever of 100.2 yesterday with chills    Fever     Discussed the use of AI scribe software for clinical note transcription with the patient, who gave verbal consent to proceed.  History of Present Illness  Kim Rogers is a 68 year old female who presents with fever, chills, and body aches.  Symptoms began on Sunday with body aches, initially thought to be due to physical exertion. By the next day, chills and fever developed, accompanied by a headache. Today, the headache persists, but body aches have improved. The highest recorded temperature is 100.43F. She has taken Tylenol and Excedrin Migraine, which have provided relief. She has not been swabbed for COVID-19 and is unaware of any recent exposure to the virus.     Health Maintenance Due  Topic Date Due   Medicare Annual Wellness (AWV)  Never done   INFLUENZA VACCINE  09/28/2023   COVID-19 Vaccine (8 - Pfizer risk 2024-25 season) 10/29/2023    Past Medical History:  Diagnosis Date   Allergic rhinitis 08/15/2012   Allergy    Annual physical exam 12/03/2018   Chronic headaches    OTC med prn   Colon polyp 12/17/2015   Seen on 09/12/12 colonoscopy by [facility], per pt.  5y f/u.  Tubular adenaoma  Seen on 09/12/12 colonoscopy by [facility], per pt.  5y f/u.  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  5-month history of nasal congestive symptoms worse at night.  Denies infectious sounding symptoms of colored rhinorrhea with facial pressure or fever.  She has tried antihistamines and decongestants with only partial relief.  It affects her ability to rest well at night.  EXAM shows nasal septum in the midline.  No polyps or puru   Osteoporosis    PCP NOTES >>>>>>>>>>>>>> 12/03/2018    Past Surgical History:  Procedure Laterality Date   AUGMENTATION MAMMAPLASTY     bilateral breast augmentation  2012   PONV with anesthesia for this surgery per patient 12/19/18   BREAST BIOPSY Right    COLONOSCOPY  09/11/2012   polyps - dr debrah   FACIAL COSMETIC SURGERY  2012   mini face lift - same surgery with breast implants   FOOT SURGERY  1994   left   SKIN BIOPSY     pre-cancer lower right abd   TONSILLECTOMY     WISDOM TOOTH EXTRACTION      Family History  Problem Relation Age of Onset   Lung cancer Mother    Hypertension Brother    CAD Brother        cardiac arrest- CABG age 50   Colon cancer Neg Hx    Breast cancer Neg Hx  Rectal cancer Neg Hx    Stomach cancer Neg Hx    Colon polyps Neg Hx    Esophageal cancer Neg Hx     Social History   Socioeconomic History   Marital status: Married    Spouse name: 1   Number of children: Not on file   Years of education: Not on file   Highest education level: Associate degree: academic program  Occupational History   Occupation: retired- Advertising account planner   Tobacco Use   Smoking status: Former    Current packs/day: 0.00    Average packs/day: 0.5 packs/day for 5.0 years (2.5 ttl pk-yrs)    Types: Cigarettes    Start date: 08/02/1971    Quit date: 08/01/1976    Years since quitting: 47.2   Smokeless tobacco: Never  Vaping Use   Vaping status: Never Used  Substance and Sexual Activity   Alcohol use: Yes    Comment: occasional glass of wine   Drug use: No   Sexual activity: Yes    Birth  control/protection: Post-menopausal  Other Topics Concern   Not on file  Social History Narrative   Household pt- husband   Social Drivers of Corporate investment banker Strain: Low Risk  (08/20/2023)   Overall Financial Resource Strain (CARDIA)    Difficulty of Paying Living Expenses: Not hard at all  Food Insecurity: No Food Insecurity (08/20/2023)   Hunger Vital Sign    Worried About Running Out of Food in the Last Year: Never true    Ran Out of Food in the Last Year: Never true  Transportation Needs: No Transportation Needs (08/20/2023)   PRAPARE - Administrator, Civil Service (Medical): No    Lack of Transportation (Non-Medical): No  Physical Activity: Sufficiently Active (08/20/2023)   Exercise Vital Sign    Days of Exercise per Week: 4 days    Minutes of Exercise per Session: 40 min  Stress: No Stress Concern Present (08/20/2023)   [employer] of Occupational Health - Occupational Stress Questionnaire    Feeling of Stress: Not at all  Social Connections: Unknown (08/20/2023)   Social Connection and Isolation Panel    Frequency of Communication with Friends and Family: More than three times a week    Frequency of Social Gatherings with Friends and Family: Twice a week    Attends Religious Services: More than 4 times per year    Active Member of [employer] or Organizations: Yes    Attends Engineer, structural: More than 4 times per year    Marital Status: Not on file  Intimate Partner Violence: Not on file    Outpatient Medications Prior to Visit  Medication Sig Dispense Refill   aspirin  EC 81 MG tablet Take 1 tablet (81 mg total) by mouth daily. Swallow whole.     atorvastatin  (LIPITOR) 10 MG tablet Take 1 tablet (10 mg total) by mouth daily. 90 tablet 3   Biotin 1000 MCG tablet Take 1,000 mcg by mouth daily.     Calcium  Carbonate-Vitamin D  (CALCIUM  + D PO) Take by mouth 2 (two) times daily.     co-enzyme Q-10 30 MG capsule Take 1 capsule (30 mg total)  by mouth 3 (three) times daily.     fish oil-omega-3 fatty acids 1000 MG capsule Take 1 g by mouth 2 (two) times daily.     Misc Natural Product Nasal (PONARIS NA) Place into the nose.     Multiple Vitamin (MULTIVITAMIN) tablet Take 1  tablet by mouth daily.     omeprazole  (PRILOSEC) 40 MG capsule Take 1 capsule (40 mg total) by mouth daily as needed. 90 capsule 3   polycarbophil (FIBERCON) 625 MG tablet Take 625 mg by mouth daily.     Probiotic Product (CULTURELLE PROBIOTICS PO) Take by mouth.     promethazine  (PHENERGAN ) 12.5 MG suppository Place 1 suppository (12.5 mg total) rectally every 8 (eight) hours as needed for nausea or vomiting. (Patient not taking: Reported on 08/20/2023) 12 each 0   Psyllium (METAMUCIL PO) Take by mouth. Takes 1 teaspoon Metamucil daily     scopolamine  (TRANSDERM-SCOP) 1 MG/3DAYS Place 1 patch (1.5 mg total) onto the skin every 3 (three) days. 4 patch 0   sodium chloride  (OCEAN) 0.65 % SOLN nasal spray Place 1 spray into both nostrils as needed for congestion.     No facility-administered medications prior to visit.    No Known Allergies  Review of Systems  Constitutional:  Positive for fever.       Objective:    Physical Exam Constitutional:      General: She is not in acute distress.    Appearance: Normal appearance. She is well-developed.  HENT:     Head: Normocephalic and atraumatic.     Right Ear: Tympanic membrane, ear canal and external ear normal.     Left Ear: Tympanic membrane, ear canal and external ear normal.     Mouth/Throat:     Pharynx: No posterior oropharyngeal erythema.     Tonsils: No tonsillar exudate or tonsillar abscesses.  Eyes:     General: No scleral icterus. Neck:     Thyroid : No thyromegaly.  Cardiovascular:     Rate and Rhythm: Normal rate and regular rhythm.     Heart sounds: Normal heart sounds. No murmur heard. Pulmonary:     Effort: Pulmonary effort is normal. No respiratory distress.     Breath sounds: Normal  breath sounds. No wheezing.  Musculoskeletal:     Cervical back: Neck supple.  Skin:    General: Skin is warm and dry.  Neurological:     Mental Status: She is alert and oriented to person, place, and time.  Psychiatric:        Mood and Affect: Mood normal.        Behavior: Behavior normal.        Thought Content: Thought content normal.        Judgment: Judgment normal.      BP 139/73 (BP Location: Right Arm, Patient Position: Sitting, Cuff Size: Small)   Pulse 82   Temp 98.8 F (37.1 C) (Oral)   Resp 16   Ht 5' 6 (1.676 m)   Wt 160 lb (72.6 kg)   LMP  (LMP Unknown)   SpO2 97%   BMI 25.82 kg/m  Wt Readings from Last 3 Encounters:  10/31/23 160 lb (72.6 kg)  08/20/23 160 lb 6 oz (72.7 kg)  05/23/23 160 lb 1.9 oz (72.6 kg)       Assessment & Plan:   Problem List Items Addressed This Visit       Unprioritized   Viral URI - Primary   New.  Covid/Flu swabs both negative. Recommended supportive care (tylenol prn, fluids, rest). Recommended retest at home for covid in 3 days.  Call if +. Call if new/worsening symptoms or if symptoms are not improved in 3-4 days.        I have discontinued Emerson M. Strine's Psyllium (METAMUCIL PO), sodium  chloride, promethazine , and scopolamine . I am also having her maintain her Calcium  Carbonate-Vitamin D  (CALCIUM  + D PO), fish oil-omega-3 fatty acids, multivitamin, Biotin, Probiotic Product (CULTURELLE PROBIOTICS PO), polycarbophil, Misc Natural Product Nasal (PONARIS NA), omeprazole , co-enzyme Q-10, aspirin  EC, and atorvastatin .  No orders of the defined types were placed in this encounter.

## 2023-11-05 DIAGNOSIS — Z85820 Personal history of malignant melanoma of skin: Secondary | ICD-10-CM | POA: Diagnosis not present

## 2023-11-05 DIAGNOSIS — L578 Other skin changes due to chronic exposure to nonionizing radiation: Secondary | ICD-10-CM | POA: Diagnosis not present

## 2023-11-05 DIAGNOSIS — D225 Melanocytic nevi of trunk: Secondary | ICD-10-CM | POA: Diagnosis not present

## 2023-11-05 DIAGNOSIS — L821 Other seborrheic keratosis: Secondary | ICD-10-CM | POA: Diagnosis not present

## 2023-11-27 DIAGNOSIS — H2511 Age-related nuclear cataract, right eye: Secondary | ICD-10-CM | POA: Diagnosis not present

## 2023-11-27 DIAGNOSIS — H18413 Arcus senilis, bilateral: Secondary | ICD-10-CM | POA: Diagnosis not present

## 2023-11-27 DIAGNOSIS — H25043 Posterior subcapsular polar age-related cataract, bilateral: Secondary | ICD-10-CM | POA: Diagnosis not present

## 2023-11-27 DIAGNOSIS — H2513 Age-related nuclear cataract, bilateral: Secondary | ICD-10-CM | POA: Diagnosis not present

## 2023-11-27 DIAGNOSIS — H47323 Drusen of optic disc, bilateral: Secondary | ICD-10-CM | POA: Diagnosis not present

## 2024-01-01 ENCOUNTER — Other Ambulatory Visit (HOSPITAL_BASED_OUTPATIENT_CLINIC_OR_DEPARTMENT_OTHER): Payer: Self-pay

## 2024-01-01 MED ORDER — FLUZONE HIGH-DOSE 0.5 ML IM SUSY
0.5000 mL | PREFILLED_SYRINGE | Freq: Once | INTRAMUSCULAR | 0 refills | Status: AC
  Filled 2024-01-01: qty 0.5, 1d supply, fill #0

## 2024-01-17 ENCOUNTER — Other Ambulatory Visit: Payer: Self-pay | Admitting: Internal Medicine

## 2024-02-25 ENCOUNTER — Other Ambulatory Visit (HOSPITAL_BASED_OUTPATIENT_CLINIC_OR_DEPARTMENT_OTHER): Payer: Self-pay | Admitting: Internal Medicine

## 2024-02-25 DIAGNOSIS — Z1231 Encounter for screening mammogram for malignant neoplasm of breast: Secondary | ICD-10-CM

## 2024-03-24 ENCOUNTER — Encounter: Admitting: Internal Medicine

## 2024-03-26 ENCOUNTER — Ambulatory Visit (HOSPITAL_BASED_OUTPATIENT_CLINIC_OR_DEPARTMENT_OTHER)

## 2024-04-04 ENCOUNTER — Ambulatory Visit: Admitting: Internal Medicine

## 2024-04-04 VITALS — BP 122/80 | HR 94 | Temp 97.9°F | Resp 16 | Ht 66.0 in | Wt 163.2 lb

## 2024-04-04 DIAGNOSIS — E559 Vitamin D deficiency, unspecified: Secondary | ICD-10-CM

## 2024-04-04 DIAGNOSIS — Z0000 Encounter for general adult medical examination without abnormal findings: Secondary | ICD-10-CM

## 2024-04-04 DIAGNOSIS — M810 Age-related osteoporosis without current pathological fracture: Secondary | ICD-10-CM

## 2024-04-04 DIAGNOSIS — E782 Mixed hyperlipidemia: Secondary | ICD-10-CM

## 2024-04-04 DIAGNOSIS — Z23 Encounter for immunization: Secondary | ICD-10-CM

## 2024-04-04 DIAGNOSIS — I2510 Atherosclerotic heart disease of native coronary artery without angina pectoris: Secondary | ICD-10-CM

## 2024-04-04 DIAGNOSIS — E785 Hyperlipidemia, unspecified: Secondary | ICD-10-CM

## 2024-04-04 LAB — CBC WITH DIFFERENTIAL/PLATELET
Absolute Lymphocytes: 2756 {cells}/uL (ref 850–3900)
Absolute Monocytes: 639 {cells}/uL (ref 200–950)
Basophils Absolute: 83 {cells}/uL (ref 0–200)
Basophils Relative: 1 %
Eosinophils Absolute: 91 {cells}/uL (ref 15–500)
Eosinophils Relative: 1.1 %
HCT: 45.5 % (ref 35.9–46.0)
Hemoglobin: 14.9 g/dL (ref 11.7–15.5)
MCH: 29.7 pg (ref 27.0–33.0)
MCHC: 32.7 g/dL (ref 31.6–35.4)
MCV: 90.8 fL (ref 81.4–101.7)
MPV: 10.6 fL (ref 7.5–12.5)
Monocytes Relative: 7.7 %
Neutro Abs: 4731 {cells}/uL (ref 1500–7800)
Neutrophils Relative %: 57 %
Platelets: 389 10*3/uL (ref 140–400)
RBC: 5.01 Million/uL (ref 3.80–5.10)
RDW: 12.2 % (ref 11.0–15.0)
Total Lymphocyte: 33.2 %
WBC: 8.3 10*3/uL (ref 3.8–10.8)

## 2024-04-04 LAB — VITAMIN D 25 HYDROXY (VIT D DEFICIENCY, FRACTURES): Vit D, 25-Hydroxy: 38 ng/mL (ref 30–100)

## 2024-04-04 MED ORDER — ATORVASTATIN CALCIUM 10 MG PO TABS: 10.0000 mg | ORAL_TABLET | Freq: Every day | ORAL | 3 refills | Status: AC

## 2024-04-04 NOTE — Patient Instructions (Signed)
 Please read your instructions carefully.   GO TO THE LAB :  Get the blood work    Go to the front desk for the checkout Please make an appointment for your next physical exam in 1 year   We are ordering a bone density test, if they do not contact you in the next few days please let me know      OSTEOPOROSIS: Previous bone density test showed osteoporosis. -Ordered a bone density test. -Encouraged weight-bearing exercises and vitamin D  supplementation. -Discussed fracture risk and non-pharmacological measures. -Discussed fall prevention    HYPERLIPIDEMIA: Managed with atorvastatin . Previous LDL was 89 mg/dL, with a target of 70 mg/dL. -Checked cholesterol levels today. -Continue atorvastatin  10 mg once daily.

## 2024-04-04 NOTE — Progress Notes (Unsigned)
 "  Subjective:    Patient ID: Kim Rogers, female    DOB: 12-27-1955, 69 y.o.   MRN: [id number]  DOS:  04/04/2024 CPX  Discussed the use of AI scribe software for clinical note transcription with the patient, who gave verbal consent to proceed.  History of Present Illness Kim Rogers is a 69 year old female who presents for an annual physical exam.  She has had migraines for 30 to 40 years but has not had a headache in the past four months without changes in medications or routine.  She had cataract surgery on her right eye on March 03, 2024.  She lives with her husband, does not smoke, drinks wine occasionally, and tries to stay active with walking and the gym.  She has no chest pain, shortness of breath, abdominal pain, or urinary symptoms. She takes daily aspirin  and atorvastatin , uses an acid reflux medication as needed, and also takes vitamins.  Her family history is negative for colon and breast cancer. She had a colonoscopy a couple of years ago and is scheduled for a mammogram next week. She no longer follows with a gynecologist.  She received a flu shot and her last pneumonia vaccine was five years ago. Prior blood work showed LDL 89 while on daily atorvastatin . She has osteoporosis, previously had low vitamin D , and is due for a bone density test.    Review of Systems See above   Past Medical History:  Diagnosis Date   Allergic rhinitis 08/15/2012   Allergy    Annual physical exam 12/03/2018   Chronic headaches    OTC med prn   Colon polyp 12/17/2015   Seen on 09/12/12 colonoscopy by [facility], per pt.  5y f/u.  Tubular adenaoma  Seen on 09/12/12 colonoscopy by [facility], per pt.  5y f/u.  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  66-month history of nasal congestive symptoms worse at night.  Denies infectious sounding symptoms of colored rhinorrhea with facial pressure or fever.  She has tried antihistamines and decongestants with only partial relief.  It affects her ability to rest well at night.  EXAM shows nasal septum in the midline.  No polyps or puru   Osteoporosis    PCP NOTES >>>>>>>>>>>>>> 12/03/2018    Past Surgical History:  Procedure Laterality Date   AUGMENTATION MAMMAPLASTY     bilateral breast augmentation  2012   PONV with anesthesia for this surgery per patient 12/19/18   BREAST BIOPSY Right    COLONOSCOPY  09/11/2012   polyps - dr debrah   FACIAL COSMETIC SURGERY  2012   mini face lift - same surgery with breast implants   FOOT SURGERY  1994   left   SKIN BIOPSY     pre-cancer lower right abd   TONSILLECTOMY     WISDOM TOOTH EXTRACTION      Current Outpatient Medications  Medication Instructions   aspirin  EC 81 mg, Oral, Daily, Swallow whole.   atorvastatin  (LIPITOR)  10 mg, Oral, Daily   Biotin 1,000 mcg, Daily   Calcium  Carbonate-Vitamin D  (CALCIUM  + D PO) 2 times daily   co-enzyme Q-10 30 mg, Oral, 3 times daily   fish oil-omega-3 fatty acids 1 g, 2 times daily   Misc Natural Product Nasal (PONARIS NA) Place into the nose.   Multiple Vitamin (MULTIVITAMIN) tablet 1 tablet, Daily   omeprazole  (PRILOSEC) 40 mg, Oral, Daily PRN   polycarbophil (FIBERCON) 625 mg, Daily   Probiotic Product (CULTURELLE PROBIOTICS PO) Take by mouth.       Objective:   Physical Exam BP 122/80   Pulse 94   Temp 97.9 F (36.6 C) (Oral)   Resp 16   Ht 5' 6 (1.676 m)   Wt 163 lb 4 oz (74 kg)   LMP  (LMP Unknown)   SpO2 97%   BMI 26.35 kg/m  General: Well developed, NAD, BMI noted Neck: No   thyromegaly  HEENT:  Normocephalic . Face symmetric, atraumatic Lungs:  CTA B Normal respiratory effort, no intercostal retractions, no accessory muscle use. Heart: RRR,  no murmur.  Abdomen:  Not distended, soft, non-tender. No rebound or rigidity.   Lower extremities: no pretibial edema bilaterally  Skin: Exposed areas without rash. Not pale. Not jaundice Neurologic:  alert & oriented X3.  Speech normal, gait appropriate for age and unassisted Strength symmetric and appropriate for age.  Psych: Cognition and judgment appear intact.  Cooperative with normal attention span and concentration.  Behavior appropriate. No anxious or depressed appearing.     Assessment     ASSESSMENT new patient 11/2018 referred by her husband, previous PCP retired Osteoporosis T score -3.0 (01/10/2013) GERD, meds PRN FH CAD, elevated coronary calcium  score (02/2023), saw cardiology. Melanoma per pt, skin lesions, sees derm Allergies  H/o MVP H/o remote migraines, occ HAs   Here for CPX Tdap: 2020 S/p shingrix  x2; RSV 2024 PNM 23: 10-2018; Prevnar 13: 2021.  PNM 20 today 04/04/2024 Had a flu shot Recommend COVID booster if not done recently. CCS: Had a colonoscopy   12-2018, C-scope 05/05/2022.  Next per GI. Female care: Will return to gyn prn, currently asx.last  PAP 12-2018  ([facility]) [facility]G scheduled for next week Labs: CMP FLP CBC vitamin D  Lifestyle: Remains active, goes to the gym, healthy diet discussed    Assessment & Plan Osteoporosis: Last T-score 2.9 (02/2021), declined treatment other than vitamin D .  Order a DEXA. Discussed fracture risk and non-pharmacological measures.  Encouraged weight-bearing exercises and vitamin D  supplementation.    Hyperlipidemia   Managed with atorvastatin , dose gradually increased currently on 10 mg a day and no side effects. Previous LDL was 89 mg/dL, with a target of 70 mg/dL. Checked cholesterol levels today.   Elevated coronary calcium  score: On  atorvastatin  and aspirin , no symptoms. RTC 1 year         "

## 2024-04-14 ENCOUNTER — Other Ambulatory Visit (HOSPITAL_BASED_OUTPATIENT_CLINIC_OR_DEPARTMENT_OTHER)

## 2024-04-14 ENCOUNTER — Ambulatory Visit (HOSPITAL_BASED_OUTPATIENT_CLINIC_OR_DEPARTMENT_OTHER)

## 2024-05-27 ENCOUNTER — Encounter: Admitting: Internal Medicine

## 2025-04-10 ENCOUNTER — Encounter: Admitting: Internal Medicine
